# Patient Record
Sex: Female | Born: 1955 | Hispanic: Yes | Marital: Single | Smoking: Never smoker
Health system: Southern US, Community
[De-identification: ages and names within clinical notes are randomized; demographics above are authoritative.]

## PROBLEM LIST (undated history)

## (undated) DIAGNOSIS — Z992 Dependence on renal dialysis: Secondary | ICD-10-CM

## (undated) DIAGNOSIS — I10 Essential (primary) hypertension: Secondary | ICD-10-CM

## (undated) DIAGNOSIS — E119 Type 2 diabetes mellitus without complications: Secondary | ICD-10-CM

## (undated) DIAGNOSIS — N186 End stage renal disease: Secondary | ICD-10-CM

## (undated) HISTORY — PX: EYE SURGERY: SHX253

## (undated) HISTORY — DX: Type 2 diabetes mellitus without complications: E11.9

## (undated) HISTORY — DX: Essential (primary) hypertension: I10

---

## 2004-07-05 ENCOUNTER — Ambulatory Visit: Payer: Self-pay

## 2004-07-12 ENCOUNTER — Ambulatory Visit: Payer: Self-pay

## 2004-08-14 ENCOUNTER — Inpatient Hospital Stay: Payer: Self-pay | Admitting: Internal Medicine

## 2004-08-14 ENCOUNTER — Other Ambulatory Visit: Payer: Self-pay

## 2004-10-10 ENCOUNTER — Ambulatory Visit: Payer: Self-pay | Admitting: Unknown Physician Specialty

## 2005-01-16 ENCOUNTER — Ambulatory Visit: Payer: Self-pay | Admitting: Ophthalmology

## 2005-02-13 ENCOUNTER — Ambulatory Visit: Payer: Self-pay | Admitting: General Surgery

## 2005-05-08 ENCOUNTER — Ambulatory Visit: Payer: Self-pay | Admitting: Ophthalmology

## 2005-05-17 ENCOUNTER — Ambulatory Visit: Payer: Self-pay | Admitting: General Surgery

## 2005-06-24 DIAGNOSIS — N186 End stage renal disease: Secondary | ICD-10-CM

## 2005-06-24 HISTORY — DX: End stage renal disease: N18.6

## 2005-10-30 ENCOUNTER — Ambulatory Visit: Payer: Self-pay

## 2006-03-12 ENCOUNTER — Ambulatory Visit: Payer: Self-pay | Admitting: *Deleted

## 2006-04-28 ENCOUNTER — Emergency Department: Payer: Self-pay | Admitting: Emergency Medicine

## 2006-09-27 ENCOUNTER — Emergency Department: Payer: Self-pay | Admitting: Emergency Medicine

## 2006-09-27 ENCOUNTER — Other Ambulatory Visit: Payer: Self-pay

## 2007-02-08 ENCOUNTER — Ambulatory Visit: Payer: Self-pay | Admitting: Emergency Medicine

## 2007-02-08 ENCOUNTER — Emergency Department: Payer: Self-pay | Admitting: Emergency Medicine

## 2008-01-05 ENCOUNTER — Ambulatory Visit: Payer: Self-pay

## 2009-02-28 ENCOUNTER — Ambulatory Visit: Payer: Self-pay

## 2010-03-20 ENCOUNTER — Ambulatory Visit: Payer: Self-pay

## 2010-03-24 ENCOUNTER — Ambulatory Visit: Payer: Self-pay | Admitting: Gynecologic Oncology

## 2010-04-05 LAB — PATHOLOGY REPORT

## 2010-04-17 ENCOUNTER — Ambulatory Visit: Payer: Self-pay | Admitting: Gynecologic Oncology

## 2010-05-22 ENCOUNTER — Ambulatory Visit: Payer: Self-pay | Admitting: Gynecologic Oncology

## 2010-05-24 ENCOUNTER — Ambulatory Visit: Payer: Self-pay | Admitting: Gynecologic Oncology

## 2010-05-29 ENCOUNTER — Ambulatory Visit: Payer: Self-pay | Admitting: Gynecologic Oncology

## 2010-06-04 LAB — PATHOLOGY REPORT

## 2010-06-05 ENCOUNTER — Ambulatory Visit: Payer: Self-pay | Admitting: Gynecologic Oncology

## 2010-06-24 ENCOUNTER — Ambulatory Visit: Payer: Self-pay | Admitting: Gynecologic Oncology

## 2010-08-07 ENCOUNTER — Ambulatory Visit: Payer: Self-pay | Admitting: Gynecologic Oncology

## 2010-08-23 ENCOUNTER — Ambulatory Visit: Payer: Self-pay | Admitting: Gynecologic Oncology

## 2011-03-05 ENCOUNTER — Ambulatory Visit: Payer: Self-pay

## 2011-09-09 ENCOUNTER — Ambulatory Visit: Payer: Self-pay | Admitting: Nephrology

## 2012-07-07 ENCOUNTER — Emergency Department: Payer: Self-pay | Admitting: Emergency Medicine

## 2012-07-21 ENCOUNTER — Ambulatory Visit: Payer: Self-pay

## 2012-07-29 ENCOUNTER — Other Ambulatory Visit: Payer: Self-pay

## 2012-07-29 LAB — POTASSIUM: Potassium: 5.6 mmol/L — ABNORMAL HIGH (ref 3.5–5.1)

## 2013-09-06 ENCOUNTER — Other Ambulatory Visit: Payer: Self-pay | Admitting: Internal Medicine

## 2013-09-06 LAB — HEMOGLOBIN: HGB: 6.2 g/dL — AB (ref 12.0–16.0)

## 2013-12-01 ENCOUNTER — Ambulatory Visit: Payer: Self-pay

## 2013-12-03 ENCOUNTER — Ambulatory Visit: Payer: Self-pay

## 2014-06-02 ENCOUNTER — Ambulatory Visit: Payer: Self-pay

## 2014-10-07 ENCOUNTER — Other Ambulatory Visit: Payer: Self-pay | Admitting: Oncology

## 2014-10-07 DIAGNOSIS — R920 Mammographic microcalcification found on diagnostic imaging of breast: Secondary | ICD-10-CM

## 2014-11-14 ENCOUNTER — Other Ambulatory Visit
Admission: RE | Admit: 2014-11-14 | Discharge: 2014-11-14 | Disposition: A | Discharge status: Home / Self Care | Payer: Self-pay | Source: Ambulatory Visit | Attending: Internal Medicine | Admitting: Internal Medicine

## 2014-11-14 DIAGNOSIS — E875 Hyperkalemia: Secondary | ICD-10-CM | POA: Insufficient documentation

## 2014-11-14 LAB — POTASSIUM: POTASSIUM: 2.6 mmol/L — AB (ref 3.5–5.1)

## 2014-11-23 ENCOUNTER — Other Ambulatory Visit: Payer: Self-pay | Admitting: Oncology

## 2014-11-23 ENCOUNTER — Ambulatory Visit
Admission: RE | Admit: 2014-11-23 | Discharge: 2014-11-23 | Disposition: A | Discharge status: Home / Self Care | Payer: Self-pay | Source: Ambulatory Visit | Attending: Oncology | Admitting: Oncology

## 2014-11-23 ENCOUNTER — Ambulatory Visit: Payer: Self-pay | Attending: Oncology

## 2014-11-23 VITALS — BP 192/77 | HR 76 | Temp 98.4°F | Resp 17 | Ht 62.21 in | Wt 131.5 lb

## 2014-11-23 DIAGNOSIS — R920 Mammographic microcalcification found on diagnostic imaging of breast: Secondary | ICD-10-CM

## 2014-11-23 DIAGNOSIS — Z0000 Encounter for general adult medical examination without abnormal findings: Secondary | ICD-10-CM

## 2014-11-23 NOTE — Progress Notes (Signed)
Subjective:     Patient ID: Vanessa Salazar, female   DOB: 1956-03-25, 59 y.o.   MRN: [id number]  HPI   Review of Systems     Objective:   Physical Exam  Pulmonary/Chest: Right breast exhibits no inverted nipple, no mass, no nipple discharge, no skin change and no tenderness. Left breast exhibits no inverted nipple, no mass, no nipple discharge, no skin change and no tenderness. Breasts are symmetrical.       Assessment:     59 year old Hispanic  patient presents for [facility] clinic visit. Patient screened, and meets [facility] eligibility.  Patient does not have insurance, Medicare or Medicaid.  Handout given on Affordable Care Act.  CBE unremarkable.  Instructed patient on breast self-exam using teach back method    Plan:        Sent for bilateral screening mammogram.

## 2014-11-30 ENCOUNTER — Other Ambulatory Visit
Admission: RE | Admit: 2014-11-30 | Discharge: 2014-11-30 | Disposition: A | Discharge status: Home / Self Care | Payer: Self-pay | Source: Other Acute Inpatient Hospital | Attending: Internal Medicine | Admitting: Internal Medicine

## 2014-11-30 DIAGNOSIS — E875 Hyperkalemia: Secondary | ICD-10-CM | POA: Insufficient documentation

## 2014-11-30 LAB — POTASSIUM: Potassium: 3.5 mmol/L (ref 3.5–5.1)

## 2015-02-15 NOTE — Progress Notes (Signed)
Mailed patient notification of BIRADS 3 annual follow up for right breast calcifications.  She is scheduled to be seen in [facility] on November 29, 2015 at 12:30 followed by a diagnostic bilateral mammogram.

## 2015-08-27 ENCOUNTER — Emergency Department: Payer: No Typology Code available for payment source

## 2015-08-27 ENCOUNTER — Emergency Department
Admission: EM | Admit: 2015-08-27 | Discharge: 2015-08-27 | Disposition: A | Discharge status: Home / Self Care | Payer: No Typology Code available for payment source | Attending: Emergency Medicine | Admitting: Emergency Medicine

## 2015-08-27 DIAGNOSIS — S8012XA Contusion of left lower leg, initial encounter: Secondary | ICD-10-CM | POA: Diagnosis not present

## 2015-08-27 DIAGNOSIS — Y998 Other external cause status: Secondary | ICD-10-CM | POA: Insufficient documentation

## 2015-08-27 DIAGNOSIS — Y9389 Activity, other specified: Secondary | ICD-10-CM | POA: Insufficient documentation

## 2015-08-27 DIAGNOSIS — Y92410 Unspecified street and highway as the place of occurrence of the external cause: Secondary | ICD-10-CM | POA: Insufficient documentation

## 2015-08-27 DIAGNOSIS — S8992XA Unspecified injury of left lower leg, initial encounter: Secondary | ICD-10-CM | POA: Diagnosis present

## 2015-08-27 DIAGNOSIS — I10 Essential (primary) hypertension: Secondary | ICD-10-CM | POA: Insufficient documentation

## 2015-08-27 DIAGNOSIS — E119 Type 2 diabetes mellitus without complications: Secondary | ICD-10-CM | POA: Diagnosis not present

## 2015-08-27 MED ORDER — TRAMADOL HCL 50 MG PO TABS: 50.0000 mg | ORAL_TABLET | Freq: Four times a day (QID) | ORAL | Status: DC | PRN
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
## 2015-11-29 ENCOUNTER — Encounter: Payer: Self-pay | Admitting: *Deleted

## 2015-11-29 ENCOUNTER — Ambulatory Visit
Admission: RE | Admit: 2015-11-29 | Discharge: 2015-11-29 | Disposition: A | Discharge status: Home / Self Care | Payer: Self-pay | Source: Ambulatory Visit | Attending: Oncology | Admitting: Oncology

## 2015-11-29 ENCOUNTER — Ambulatory Visit: Payer: Self-pay | Attending: Oncology | Admitting: *Deleted

## 2015-11-29 ENCOUNTER — Ambulatory Visit: Payer: Self-pay

## 2015-11-29 VITALS — BP 211/85 | HR 72 | Temp 98.2°F | Ht 63.39 in | Wt 129.9 lb

## 2015-11-29 DIAGNOSIS — Z0000 Encounter for general adult medical examination without abnormal findings: Secondary | ICD-10-CM

## 2015-11-29 DIAGNOSIS — N63 Unspecified lump in unspecified breast: Secondary | ICD-10-CM

## 2015-11-29 NOTE — Progress Notes (Signed)
Subjective:     Patient ID: Vanessa Salazar, female   DOB: Sep 15, 1955, 60 y.o.   MRN: [id number]  HPI   Review of Systems     Objective:   Physical Exam  Pulmonary/Chest: Right breast exhibits no inverted nipple, no mass, no nipple discharge, no skin change and no tenderness. Left breast exhibits no inverted nipple, no mass, no nipple discharge, no skin change and no tenderness. Breasts are symmetrical.    Abdominal:    Genitourinary: No labial fusion. There is no rash, tenderness, lesion or injury on the right labia. There is no rash, tenderness, lesion or injury on the left labia. Cervix exhibits no motion tenderness, no discharge and no friability. No erythema, tenderness or bleeding in the vagina. No foreign body around the vagina. No signs of injury around the vagina. No vaginal discharge found.       Assessment:     60 year old Hispanic [facility].  Last mammo was a birads 3 for 1 year f/u for calcs.  Maritza, the interpreter present during the interview and exam.  Patient has a dressing over the the shunt for her dialysis in the left forearm.  Palpable thrill noted.  On clinical    breast exam I can palpate a thickening in the upper outer quadrant of the right breast.  No dominant mass noted.  Pelvic exam somewhat difficult due to patients inability to tolerate the bimanual exam.  I could only use one finger.  Blood pressure elevated at 211/85.  She is to recheck her blood pressure at [facility] or [facility], and if remains higher than 140/90 she is to follow-up with her primary care or  Provider who prescribes her blood pressure meds.  Hand out on hypertention given to patient. Patient has been screened for eligibility.  She does not have any insurance, Medicare or Medicaid.  She also meets financial eligibility.  Hand-out given on the Affordable Care Act. Plan:     Bilateral diagnostic mammogram ordered with ultrasound for follow-up calcifications and  right breast thickening.

## 2015-11-29 NOTE — Patient Instructions (Signed)
Hipertensin (Hypertension) La hipertensin, conocida comnmente como presin arterial alta, se produce cuando la sangre bombea en las arterias con mucha fuerza. Las arterias son los vasos sanguneos que transportan la sangre desde el corazn hacia todas las partes del cuerpo. Una lectura de la presin arterial consiste en un nmero ms alto sobre un nmero ms bajo, por ejemplo, 110/72. El nmero ms alto (presin sistlica) corresponde a la presin interna de las arterias cuando el corazn bombea sangre. El nmero ms bajo (presin diastlica) corresponde a la presin interna de las arterias cuando el corazn se relaja. En condiciones ideales, la presin arterial debe ser inferior a 120/80. La hipertensin fuerza al corazn a trabajar ms para bombear la sangre. Las arterias pueden estrecharse o ponerse rgidas. La hipertensin no tratada o no controlada puede causar infarto de miocardio, ictus, enfermedad renal y otros problemas. FACTORES DE RIESGO Algunos factores de riesgo de hipertensin son controlables, pero otros no lo son.  Entre los factores de riesgo que usted no puede controlar, se incluyen los siguientes:   La raza. El riesgo es mayor para las personas afroamericanas.  La edad. Los riesgos aumentan con la edad.  El sexo. Antes de los 45aos, los hombres corren ms riesgo que las mujeres. Despus de los 65aos, las mujeres corren ms riesgo que los hombres. Entre los factores de riesgo que usted puede controlar, se incluyen los siguientes:  No hacer la cantidad suficiente de actividad fsica o ejercicio.  Tener sobrepeso.  Consumir mucha grasa, azcar, caloras o sal en la dieta.  Beber alcohol en exceso. SIGNOS Y SNTOMAS Por lo general, la hipertensin no causa signos o sntomas. La hipertensin arterial demasiado alta (crisis hipertensiva) puede causar dolor de cabeza, ansiedad, falta de aire y hemorragia nasal. DIAGNSTICO Para detectar si usted tiene hipertensin, el  mdico le medir la presin arterial mientras est sentado, con el brazo levantado a la altura del corazn. Debe medirla al menos dos veces en el mismo brazo. Determinadas condiciones pueden causar una diferencia de presin arterial entre el brazo izquierdo y el derecho. El hecho de tener una sola lectura de la presin arterial ms alta que lo normal no significa que necesita un tratamiento. Si no est claro si tiene hipertensin arterial, es posible que se le pida que regrese otro da para volver a controlarle la presin arterial. O bien se le puede pedir que se controle la presin arterial en su casa durante 1 o ms meses. TRATAMIENTO El tratamiento de la hipertensin arterial incluye hacer cambios en el estilo de vida y, posiblemente, tomar medicamentos. Un estilo de vida saludable puede ayudar a bajar la presin arterial alta. Quiz deba cambiar algunos hbitos. Los cambios en el estilo de vida pueden incluir lo siguiente:  Seguir la dieta DASH. Esta dieta tiene un alto contenido de frutas, verduras y cereales integrales. Incluye poca cantidad de sal, carnes rojas y azcares agregados.  Mantenga el consumo de sodio por debajo de 2300 mg por da.  Realizar al menos entre 30 y 45 minutos de ejercicio aerbico, 4 veces por semana como mnimo.  Perder peso, si es necesario.  No fumar.  Limitar el consumo de bebidas alcohlicas.  Aprender formas de reducir el estrs. El mdico puede recetarle medicamentos si los cambios en el estilo de vida no son suficientes para lograr controlar la presin arterial y si una de las siguientes afirmaciones es verdadera:  Tiene entre 18 y 59 aos y su presin arterial sistlica est por encima de 140.  Tiene   60 aos o ms y su presin arterial sistlica est por encima de 150.  Su presin arterial diastlica est por encima de 90.  Tiene diabetes y su presin arterial sistlica est por encima de 140 o su presin arterial diastlica est por encima de  90.  Tiene una enfermedad renal y su presin arterial est por encima de 140/90.  Tiene una enfermedad cardaca y su presin arterial est por encima de 140/90. La presin arterial deseada puede variar en funcin de las enfermedades, la edad y otros factores personales. INSTRUCCIONES PARA EL CUIDADO EN EL HOGAR  Haga que le midan de nuevo la presin arterial segn las indicaciones del mdico.  Tome los medicamentos solamente como se lo haya indicado el mdico. Siga cuidadosamente las indicaciones. Los medicamentos para la presin arterial deben tomarse segn las indicaciones. Los medicamentos pierden eficacia al omitir las dosis. El hecho de omitir las dosis tambin Lesothoaumenta el riesgo de otros problemas.  No fume.  Contrlese la presin arterial en su casa segn las indicaciones del mdico. SOLICITE ATENCIN MDICA SI:   Piensa que tiene una reaccin alrgica a los medicamentos.  Tiene mareos o dolores de cabeza con Naval architectrecurrencia.  Tiene hinchazn en los tobillos.  Tiene problemas de visin. SOLICITE ATENCIN MDICA DE INMEDIATO SI:  Siente un dolor de cabeza intenso o confusin.  Siente debilidad inusual, adormecimiento o que Hospital doctorse desmayar.  Siente dolor intenso en el pecho o en el abdomen.  Vomita repetidas veces.  Tiene dificultad para respirar. ASEGRESE DE QUE:   Comprende estas instrucciones.  Controlar su afeccin.  Recibir ayuda de inmediato si no mejora o si empeora.   Esta informacin no tiene Theme park managercomo fin reemplazar el consejo del mdico. Asegrese de hacerle al mdico cualquier pregunta que tenga.   Document Released: 06/10/2005 Document Revised: 10/25/2014 Elsevier Interactive Patient Education 2016 [employer]  Gave patient hand-out, [facility] Staying Healthy, Active and Well [facility], with education on breast health, pap smears, heart and colon health.

## 2015-12-01 ENCOUNTER — Ambulatory Visit
Admission: RE | Admit: 2015-12-01 | Discharge: 2015-12-01 | Disposition: A | Discharge status: Home / Self Care | Payer: Self-pay | Source: Ambulatory Visit | Attending: Oncology | Admitting: Oncology

## 2015-12-01 ENCOUNTER — Ambulatory Visit: Admission: RE | Admit: 2015-12-01 | Payer: Self-pay | Source: Ambulatory Visit

## 2015-12-01 DIAGNOSIS — R921 Mammographic calcification found on diagnostic imaging of breast: Secondary | ICD-10-CM | POA: Insufficient documentation

## 2015-12-12 ENCOUNTER — Encounter: Payer: Self-pay | Admitting: *Deleted

## 2015-12-13 ENCOUNTER — Encounter: Payer: Self-pay | Admitting: *Deleted

## 2015-12-13 NOTE — Progress Notes (Signed)
Letter mailed to inform patient of stable mammogram and need to return in one year.  Next [facility] appointment on 12/09/16 @ 8:00.  [facility] [place].

## 2016-08-25 ENCOUNTER — Inpatient Hospital Stay
Admission: EM | Admit: 2016-08-25 | Discharge: 2016-09-02 | DRG: 535 | Disposition: A | Discharge status: Home / Self Care | Payer: Medicaid Other | Attending: Internal Medicine | Admitting: Internal Medicine

## 2016-08-25 ENCOUNTER — Emergency Department: Payer: Medicaid Other

## 2016-08-25 DIAGNOSIS — S329XXA Fracture of unspecified parts of lumbosacral spine and pelvis, initial encounter for closed fracture: Secondary | ICD-10-CM | POA: Diagnosis present

## 2016-08-25 DIAGNOSIS — E875 Hyperkalemia: Secondary | ICD-10-CM | POA: Diagnosis present

## 2016-08-25 DIAGNOSIS — W109XXA Fall (on) (from) unspecified stairs and steps, initial encounter: Secondary | ICD-10-CM | POA: Diagnosis present

## 2016-08-25 DIAGNOSIS — Z79899 Other long term (current) drug therapy: Secondary | ICD-10-CM

## 2016-08-25 DIAGNOSIS — R509 Fever, unspecified: Secondary | ICD-10-CM

## 2016-08-25 DIAGNOSIS — Z8673 Personal history of transient ischemic attack (TIA), and cerebral infarction without residual deficits: Secondary | ICD-10-CM

## 2016-08-25 DIAGNOSIS — S32591A Other specified fracture of right pubis, initial encounter for closed fracture: Principal | ICD-10-CM | POA: Diagnosis present

## 2016-08-25 DIAGNOSIS — W19XXXA Unspecified fall, initial encounter: Secondary | ICD-10-CM

## 2016-08-25 DIAGNOSIS — N186 End stage renal disease: Secondary | ICD-10-CM | POA: Diagnosis present

## 2016-08-25 DIAGNOSIS — E11649 Type 2 diabetes mellitus with hypoglycemia without coma: Secondary | ICD-10-CM | POA: Diagnosis not present

## 2016-08-25 DIAGNOSIS — Y92009 Unspecified place in unspecified non-institutional (private) residence as the place of occurrence of the external cause: Secondary | ICD-10-CM

## 2016-08-25 DIAGNOSIS — I120 Hypertensive chronic kidney disease with stage 5 chronic kidney disease or end stage renal disease: Secondary | ICD-10-CM | POA: Diagnosis present

## 2016-08-25 DIAGNOSIS — Z794 Long term (current) use of insulin: Secondary | ICD-10-CM

## 2016-08-25 DIAGNOSIS — R0902 Hypoxemia: Secondary | ICD-10-CM | POA: Diagnosis not present

## 2016-08-25 DIAGNOSIS — E1122 Type 2 diabetes mellitus with diabetic chronic kidney disease: Secondary | ICD-10-CM | POA: Diagnosis present

## 2016-08-25 DIAGNOSIS — Z992 Dependence on renal dialysis: Secondary | ICD-10-CM

## 2016-08-25 DIAGNOSIS — D631 Anemia in chronic kidney disease: Secondary | ICD-10-CM | POA: Diagnosis present

## 2016-08-25 DIAGNOSIS — I5033 Acute on chronic diastolic (congestive) heart failure: Secondary | ICD-10-CM | POA: Diagnosis not present

## 2016-08-25 HISTORY — DX: End stage renal disease: N18.6

## 2016-08-25 HISTORY — DX: Dependence on renal dialysis: Z99.2

## 2016-08-25 LAB — CBC WITH DIFFERENTIAL/PLATELET
BASOS ABS: 0 10*3/uL (ref 0–0.1)
BASOS PCT: 0 %
EOS ABS: 0.1 10*3/uL (ref 0–0.7)
EOS PCT: 1 %
HEMATOCRIT: 27.5 % — AB (ref 35.0–47.0)
Hemoglobin: 9 g/dL — ABNORMAL LOW (ref 12.0–16.0)
Lymphocytes Relative: 8 %
Lymphs Abs: 0.4 10*3/uL — ABNORMAL LOW (ref 1.0–3.6)
MCH: 32.7 pg (ref 26.0–34.0)
MCHC: 32.9 g/dL (ref 32.0–36.0)
MCV: 99.6 fL (ref 80.0–100.0)
MONO ABS: 0.3 10*3/uL (ref 0.2–0.9)
MONOS PCT: 6 %
Neutro Abs: 4.4 10*3/uL (ref 1.4–6.5)
Neutrophils Relative %: 85 %
PLATELETS: 136 10*3/uL — AB (ref 150–440)
RBC: 2.76 MIL/uL — ABNORMAL LOW (ref 3.80–5.20)
RDW: 19.3 % — AB (ref 11.5–14.5)
WBC: 5.2 10*3/uL (ref 3.6–11.0)

## 2016-08-25 LAB — BASIC METABOLIC PANEL
ANION GAP: 14 (ref 5–15)
BUN: 57 mg/dL — ABNORMAL HIGH (ref 6–20)
CALCIUM: 9.1 mg/dL (ref 8.9–10.3)
CO2: 20 mmol/L — AB (ref 22–32)
Chloride: 103 mmol/L (ref 101–111)
Creatinine, Ser: 8.75 mg/dL — ABNORMAL HIGH (ref 0.44–1.00)
GFR, EST AFRICAN AMERICAN: 5 mL/min — AB (ref >60)
GFR, EST NON AFRICAN AMERICAN: 4 mL/min — AB (ref >60)
Glucose, Bld: 270 mg/dL — ABNORMAL HIGH (ref 65–99)
Potassium: 5.8 mmol/L — ABNORMAL HIGH (ref 3.5–5.1)
SODIUM: 137 mmol/L (ref 135–145)

## 2016-08-25 MED ORDER — MORPHINE SULFATE (PF) 4 MG/ML IV SOLN
4.0000 mg | Freq: Once | INTRAVENOUS | Status: AC
  Administered 2016-08-25: 4 mg via INTRAVENOUS
  Filled 2016-08-25: qty 1

## 2016-08-25 MED ORDER — HYDROMORPHONE HCL 1 MG/ML IJ SOLN
0.5000 mg | Freq: Once | INTRAMUSCULAR | Status: AC
  Administered 2016-08-25: 0.5 mg via INTRAVENOUS
  Filled 2016-08-25: qty 1

## 2016-08-25 NOTE — ED Triage Notes (Addendum)
Pt arrived via EMS from home after a fall; pt c/o pain to right hip and right inner thigh area; unable to bear weight after fall; pt was going to the bathroom for a bowel movement when she fell; pt arrived incontinent of large amount of stool; cleaned well and pt placed in clean gown; pt embarrassed but appreciative; pt speaks Spanish only; interpreter now present;

## 2016-08-25 NOTE — H&P (Signed)
History and Physical   [facility] [place] [facility] Ad[facility] History and Physical [employer] Juna Caban, D.O.    Patient Name: Vanessa Salazar MR#: [id number] Date of Birth: 25-Dec-1955 Date of Admission: 08/25/2016  Referring MD/NP/PA: Dr. Derrill KayGoodman Primary Care Physician: No PCP Per Patient Outpatient Special[facility]  Patient coming from: Home  Chief Complaint: Fall  HPI: Vanessa Salazar is a 61 y.o. female with a known history of DM, HTN, ESRD on HD, anemia, TIA was in a usual state of health until this evening when she sustained a mechanical fall landing on her right hip. Patient states that she did not see a threshold leading from one room to another. She denies any preceding symptoms suggesting syncope or loss of consciousness. He denies head trauma. Patient was unable to ambulate following the fall. She has received Dilaudid, morphine and oxycodone in the emergency department and reports no relief of her pain.   Otherwise there has been no change in status. Patient has been taking medication as prescribed and there has been no recent change in medication or diet.  No recent antibiotics.  There has been no recent illness, hospitalizations, travel or sick contacts.    Patient denies fevers/chills, weakness, dizziness, chest pain, shortness of breath, N/V/C/D, abdominal pain, dysuria/frequency, changes in mental status.   ED Course: Patient received Dilaudid, morphine  Review of Systems:  CONSTITUTIONAL: No fever/chills, fatigue, weakness, weight gain/loss, headache. EYES: No blurry or double vision. ENT: No tinnitus, postnasal drip, redness or soreness of the oropharynx. RESPIRATORY: No cough, dyspnea, wheeze.  No hemoptysis.  CARDIOVASCULAR: No chest pain, palpitations, syncope, orthopnea. No lower extremity edema.  GASTROINTESTINAL: No nausea, vomiting, abdominal pain, diarrhea, constipation.  No hematemesis, melena or hematochezia. GENITOURINARY: No  dysuria, frequency, hematuria. ENDOCRINE: No polyuria or nocturia. No heat or cold intolerance. HEMATOLOGY: No anemia, bruising, bleeding. INTEGUMENTARY: No rashes, ulcers, lesions. MUSCULOSKELETAL: No arthritis, gout, dyspnea. positive right hip and pelvis pain  NEUROLOGIC: No numbness, tingling, ataxia, seizure-type activity, weakness. PSYCHIATRIC: No anxiety, depression, insomnia.   Past Medical History:  Diagnosis Date  . Diabetes mellitus without complication   . Hypertension   End-stage renal disease on hemodialysis, chronic anemia  Past Surgical History:  Procedure Laterality Date  . CESAREAN SECTION     X3  . EYE SURGERY       reports that she has never smoked. She has never used smokeless tobacco. She reports that she does not drink alcohol or use drugs.  No Known Allergies  No family history on file. Family history has been reviewed and confirmed with patient.   Prior to Admission medications   Medication Sig Start Date End Date Taking? Authorizing Provider  traMADol (ULTRAM) 50 MG tablet Take 1 tablet (50 mg total) by mouth every 6 (six) hours as needed. 08/27/15 08/26/16  Evangeline Dakinharles M Beers, PA-C    Physical Exam: Vitals:   08/25/16 2034 08/25/16 2035 08/25/16 2211 08/25/16 2230  BP: 125/67  (!) 155/69 (!) 152/72  Pulse: 77  71 74  Resp: 18  18   Temp: 97.8 F (36.6 C)     TempSrc: Oral     SpO2: 98%  98% 99%  Weight:  55 kg (121 lb 4.1 oz)    Height:  5\' 2"  (1.575 m)      GENERAL: 61 y.o.-year-old hispanic female patient, well-developed, well-nourished lying in the bed in mild distress.  HEENT: Head atraumatic, normocephalic. Pupils equal, round, reactive to light and accommodation. No  scleral icterus. Extraocular muscles intact. Nares are patent. Oropharynx is clear. Mucus membranes moist. NECK: Supple, full range of motion. No JVD, no bruit heard. No thyroid enlargement, no tenderness, no cervical lymphadenopathy. CHEST: Normal breath sounds bilaterally. No  wheezing, rales, rhonchi or crackles. No use of accessory muscles of respiration.  No reproducible chest wall tenderness.  CARDIOVASCULAR: S1, S2 normal. No murmurs, rubs, or gallops. Cap refill <2 seconds. Pulses intact distally.  ABDOMEN: Soft, nondistended, nontender. No rebound, guarding, rigidity. Normoactive bowel sounds present in all four quadrants. No organomegaly or mass. EXTREMITIES:  tenderness to palpation over the right hip. Minimal range of motion secondary to painNo pedal edema, cyanosis, or clubbing. No calf tenderness or Homan's sign.  NEUROLOGIC: The patient is alert and oriented x 3. Cranial nerves II through XII are grossly intact with no focal sensorimotor deficit. Muscle strength 5/5 in all extremities. Sensation intact. Gait not checked.   Labs on Admission:  CBC:  Recent Labs Lab 08/25/16 2204  WBC 5.2  NEUTROABS 4.4  HGB 9.0*  HCT 27.5*  MCV 99.6  PLT 136*   Basic Metabolic Panel:  Recent Labs Lab 08/25/16 2204  NA 137  K 5.8*  CL 103  CO2 20*  GLUCOSE 270*  BUN 57*  CREATININE 8.75*  CALCIUM 9.1   GFR: Estimated Creatinine Clearance: 5.5 mL/min (by C-G formula based on SCr of 8.75 mg/dL (H)). Liver Function Tests: No results for input(s): AST, ALT, ALKPHOS, BILITOT, PROT, ALBUMIN in the last 168 hours. No results for input(s): LIPASE, AMYLASE in the last 168 hours. No results for input(s): AMMONIA in the last 168 hours. Coagulation Profile: No results for input(s): INR, PROTIME in the last 168 hours. Cardiac Enzymes: No results for input(s): CKTOTAL, CKMB, CKMBINDEX, TROPONINI in the last 168 hours. BNP (last 3 results) No results for input(s): PROBNP in the last 8760 hours. HbA1C: No results for input(s): HGBA1C in the last 72 hours. CBG: No results for input(s): GLUCAP in the last 168 hours. Lipid Profile: No results for input(s): CHOL, HDL, LDLCALC, TRIG, CHOLHDL, LDLDIRECT in the last 72 hours. Thyroid Function Tests: No results for  input(s): TSH, T4TOTAL, FREET4, T3FREE, THYROIDAB in the last 72 hours. Anemia Panel: No results for input(s): VITAMINB12, FOLATE, FERRITIN, TIBC, IRON, RETICCTPCT in the last 72 hours. Urine analysis: No results found for: COLORURINE, APPEARANCEUR, LABSPEC, PHURINE, GLUCOSEU, HGBUR, BILIRUBINUR, KETONESUR, PROTEINUR, UROBILINOGEN, NITRITE, LEUKOCYTESUR Sepsis Labs: @LABRCNTIP (procalcitonin:4,lacticidven:4) )No results found for this or any previous visit (from the past 240 hour(s)).   Radiological Exams on Admission: Dg Hip Unilat W Or Wo Pelvis 2-3 Views Right  Result Date: 08/25/2016 CLINICAL DATA:  Right hip and inner thigh pain. Unable to bear weight after fall. EXAM: DG HIP (WITH OR WITHOUT PELVIS) 2-3V RIGHT COMPARISON:  None. FINDINGS: There is an acute minimally displaced fracture of the right superior pubic ramus and parasymphysis. Fracture likely involves the inferior pubic ramus near the pubic symphysis given its subtle lucency. AVN of the right femoral head with flattening is noted. Slight joint space narrowing of both hips. The visualized lumbar spine is intact. 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 The iliac bones are maintained. Sclerotic foci of the left iliac bone and left femoral head are keeping with bone islands. IMPRESSION: 1. AVN of the right femoral head with slight flattening of the weight-bearing portion of the femoral head. 2. There is a right minimally displaced superior pubic ramus fracture with more subtle lucency suspicious for fracture at the junction  of the inferior pubic ramus and pubic symphysis. Electronically Signed   By: Tollie Eth M.D.   On: 08/25/2016 21:13   Assessment/Plan   This is a 61 y.o. female with a history of DM, HTN, ESRD on HD, anemia, TIA  now being admitted with:  1. R pelvic fracture s/p mechanical fall -Admit inpatient -Ortho consult -Pain control -PT evaluation  2. H/o Diabetes - Accuchecks achs with RISS coverage - Heart  healthy, carb controlled diet  3. Hypertension - Monitor  4. History of end-stage renal disease on hemodialysis Monday Wednesday Friday -Nephrology consult for hemodialysis   Admission status: Inpatient IV Fluids: Normal saline Diet/Nutrition: Heart healthy, carb controlled Consults called: Ortho, nephrology DVT Px: Heparin SCDs and early ambulation. Code Status: Full Code  Disposition Plan: To be determined   All the records are reviewed and case discussed with ED provider. Management plans discussed with the patient and/or family who express understanding and agree with plan of care.  Granvel Proudfoot D.O. on 08/25/2016 at 11:14 PM Between 7am to 6pm - Pager - [phone] After 6pm go to [email or web address] - [employer] [facility] [place] [facility] Office [phone] CC: Primary care physician; No PCP Per Patient   08/25/2016, 11:14 PM

## 2016-08-25 NOTE — ED Notes (Signed)
Patient transported to X-ray 

## 2016-08-25 NOTE — ED Provider Notes (Signed)
[facility] Emergency Department Provider Note   ____________________________________________   I have reviewed the triage vital signs and the nursing notes.   HISTORY  Chief Complaint Fall and Hip Pain   History limited by: Language [facility] Interpreter utilized   HPI Vanessa Salazar is a 61 y.o. female who presents to the emergency department today because of concerns for right hip pain after a fall. The patient states that she fell down 2 steps. She did not appreciate that the steps were there as when she fell. She denied blacking out. She denied hitting her head. The patient denies pain anywhere else. Per EMS patient was not able to stand on the leg after this happened. She denies any hip injuries in the past.   Past Medical History:  Diagnosis Date  . Diabetes mellitus without complication   . Hypertension     There are no active problems to display for this patient.   Past Surgical History:  Procedure Laterality Date  . CESAREAN SECTION     X3  . EYE SURGERY      Prior to Admission medications   Medication Sig Start Date End Date Taking? Authorizing Provider  traMADol (ULTRAM) 50 MG tablet Take 1 tablet (50 mg total) by mouth every 6 (six) hours as needed. 08/27/15 08/26/16  Evangeline Dakin, PA-C    Allergies Patient has no known allergies.  No family history on file.  Social History Social History  Substance Use Topics  . Smoking status: Never Smoker  . Smokeless tobacco: Never Used  . Alcohol use No    Review of Systems  Constitutional: Negative for fever. Cardiovascular: Negative for chest pain. Respiratory: Negative for shortness of breath. Gastrointestinal: Negative for abdominal pain, vomiting and diarrhea. Genitourinary: Negative for dysuria. Musculoskeletal: Positive for right hip pain. Skin: Negative for rash. Neurological: Negative for headaches, focal weakness or numbness.  10-point ROS otherwise  negative.  ____________________________________________   PHYSICAL EXAM:  VITAL SIGNS: ED Triage Vitals  Enc Vitals Group     BP 08/25/16 2034 125/67     Pulse Rate 08/25/16 2034 77     Resp 08/25/16 2034 18     Temp 08/25/16 2034 97.8 F (36.6 C)     Temp Source 08/25/16 2034 Oral     SpO2 08/25/16 2034 98 %     Weight 08/25/16 2035 121 lb 4.1 oz (55 kg)     Height 08/25/16 2035 5\' 2"  (1.575 m)     Head Circumference --      Peak Flow --      Pain Score 08/25/16 2036 10    Constitutional: Alert and oriented. Well appearing and in no distress. Eyes: Conjunctivae are normal. Normal extraocular movements. ENT   Head: Normocephalic and atraumatic.   Nose: No congestion/rhinnorhea.   Mouth/Throat: Mucous membranes are moist.   Neck: No stridor. Hematological/Lymphatic/Immunilogical: No cervical lymphadenopathy. Cardiovascular: Normal rate, regular rhythm.  No murmurs, rubs, or gallops. Respiratory: Normal respiratory effort without tachypnea nor retractions. Breath sounds are clear and equal bilaterally. No wheezes/rales/rhonchi. Gastrointestinal: Soft and non tender. No rebound. No guarding.  Genitourinary: Deferred Musculoskeletal: Right leg not shortened nor externally rotated. Tender to palpation and rotation. Neurologic:  Normal speech and language. No gross focal neurologic deficits are appreciated.  Skin:  Skin is warm, dry and intact. No rash noted. Psychiatric: Mood and affect are normal. Speech and behavior are normal. Patient exhibits appropriate insight and judgment.  ____________________________________________  LABS (pertinent positives/negatives)  Labs Reviewed  CBC WITH DIFFERENTIAL/PLATELET - Abnormal; Notable for the following:       Result Value   RBC 2.76 (*)    Hemoglobin 9.0 (*)    HCT 27.5 (*)    RDW 19.3 (*)    Platelets 136 (*)    Lymphs Abs 0.4 (*)    All other components within normal limits  BASIC METABOLIC PANEL -  Abnormal; Notable for the following:    Potassium 5.8 (*)    CO2 20 (*)    Glucose, Bld 270 (*)    BUN 57 (*)    Creatinine, Ser 8.75 (*)    GFR calc non Af Amer 4 (*)    GFR calc Af Amer 5 (*)    All other components within normal limits     ____________________________________________   EKG  None  ____________________________________________    RADIOLOGY  Right hip IMPRESSION:  1. AVN of the right femoral head with slight flattening of the  weight-bearing portion of the femoral head.  2. There is a right minimally displaced superior pubic ramus  fracture with more subtle lucency suspicious for fracture at the  junction of the inferior pubic ramus and pubic symphysis.   I, Denya Buckingham, personally viewed and evaluated these images (plain radiographs) as part of my medical decision making. ____________________________________________   PROCEDURES  Procedures  ____________________________________________   INITIAL IMPRESSION / ASSESSMENT AND PLAN / ED COURSE  Pertinent labs & imaging results that were available during my care of the patient were reviewed by me and considered in my medical decision making (see chart for details).  Patient presented to the emergency department today with right hip pain after a fall. On exam patient's leg is neither shortened or externally rotated. X-rays do show a pubic rami fracture. Patient will be given IV pain medication here. We will plan on admission to hospital service.  ____________________________________________   FINAL CLINICAL IMPRESSION(S) / ED DIAGNOSES  Final diagnoses:  Fall, initial encounter  Closed nondisplaced fracture of pelvis, unspecified part of pelvis, initial encounter [facility](HCC)     Note: This dictation was prepared with Dragon dictation. Any transcriptional errors that result from this process are unintentional     Phineas SemenGraydon Lynae Pederson, MD 08/25/16 [phone]

## 2016-08-26 LAB — CBC
HEMATOCRIT: 23.3 % — AB (ref 35.0–47.0)
HEMOGLOBIN: 7.9 g/dL — AB (ref 12.0–16.0)
MCH: 33.8 pg (ref 26.0–34.0)
MCHC: 33.9 g/dL (ref 32.0–36.0)
MCV: 99.7 fL (ref 80.0–100.0)
PLATELETS: 112 10*3/uL — AB (ref 150–440)
RBC: 2.34 MIL/uL — AB (ref 3.80–5.20)
RDW: 19.4 % — ABNORMAL HIGH (ref 11.5–14.5)
WBC: 5.4 10*3/uL (ref 3.6–11.0)

## 2016-08-26 LAB — COMPREHENSIVE METABOLIC PANEL
ALK PHOS: 115 U/L (ref 38–126)
ALT: 11 U/L — AB (ref 14–54)
AST: 20 U/L (ref 15–41)
Albumin: 3.7 g/dL (ref 3.5–5.0)
Anion gap: 11 (ref 5–15)
BILIRUBIN TOTAL: 0.6 mg/dL (ref 0.3–1.2)
BUN: 62 mg/dL — AB (ref 6–20)
CALCIUM: 8.5 mg/dL — AB (ref 8.9–10.3)
CHLORIDE: 104 mmol/L (ref 101–111)
CO2: 22 mmol/L (ref 22–32)
CREATININE: 9.22 mg/dL — AB (ref 0.44–1.00)
GFR, EST AFRICAN AMERICAN: 5 mL/min — AB (ref >60)
GFR, EST NON AFRICAN AMERICAN: 4 mL/min — AB (ref >60)
Glucose, Bld: 188 mg/dL — ABNORMAL HIGH (ref 65–99)
Potassium: 5.8 mmol/L — ABNORMAL HIGH (ref 3.5–5.1)
Sodium: 137 mmol/L (ref 135–145)
Total Protein: 6.2 g/dL — ABNORMAL LOW (ref 6.5–8.1)

## 2016-08-26 LAB — GLUCOSE, CAPILLARY
GLUCOSE-CAPILLARY: 224 mg/dL — AB (ref 65–99)
GLUCOSE-CAPILLARY: 42 mg/dL — AB (ref 65–99)
GLUCOSE-CAPILLARY: 85 mg/dL (ref 65–99)
Glucose-Capillary: 208 mg/dL — ABNORMAL HIGH (ref 65–99)
Glucose-Capillary: 82 mg/dL (ref 65–99)
Glucose-Capillary: 126 mg/dL — ABNORMAL HIGH (ref 65–99)
Glucose-Capillary: 57 mg/dL — ABNORMAL LOW (ref 65–99)

## 2016-08-26 MED ORDER — OXYCODONE HCL 5 MG PO TABS
5.0000 mg | ORAL_TABLET | ORAL | Status: DC | PRN
  Administered 2016-08-26 – 2016-08-27 (×4): 10 mg via ORAL
  Administered 2016-08-28 – 2016-08-30 (×7): 5 mg via ORAL
  Filled 2016-08-26 (×2): qty 1
  Filled 2016-08-26: qty 2
  Filled 2016-08-26 (×2): qty 1
  Filled 2016-08-26: qty 2
  Filled 2016-08-26 (×2): qty 1
  Filled 2016-08-26 (×2): qty 2
  Filled 2016-08-26: qty 1

## 2016-08-26 MED ORDER — INSULIN ASPART PROT & ASPART (70-30 MIX) 100 UNIT/ML ~~LOC~~ SUSP
8.0000 [IU] | Freq: Two times a day (BID) | SUBCUTANEOUS | Status: DC
Start: 2016-08-26 — End: 2016-08-27
  Administered 2016-08-26 (×2): 8 [IU] via SUBCUTANEOUS
  Filled 2016-08-26 (×2): qty 8
  Filled 2016-08-26: qty 3

## 2016-08-26 MED ORDER — ACETAMINOPHEN 325 MG PO TABS
650.0000 mg | ORAL_TABLET | Freq: Four times a day (QID) | ORAL | Status: DC | PRN
  Administered 2016-08-27 (×2): 650 mg via ORAL
  Filled 2016-08-26 (×2): qty 2

## 2016-08-26 MED ORDER — MAGNESIUM CITRATE PO SOLN
1.0000 | Freq: Once | ORAL | Status: AC | PRN
  Administered 2016-09-01: 1 via ORAL
  Filled 2016-08-26: qty 296

## 2016-08-26 MED ORDER — ATORVASTATIN CALCIUM 20 MG PO TABS
20.0000 mg | ORAL_TABLET | Freq: Every evening | ORAL | Status: DC
  Administered 2016-08-26 – 2016-09-01 (×7): 20 mg via ORAL
  Filled 2016-08-26 (×7): qty 1

## 2016-08-26 MED ORDER — ONDANSETRON HCL 4 MG/2ML IJ SOLN
4.0000 mg | Freq: Four times a day (QID) | INTRAMUSCULAR | Status: DC | PRN
  Administered 2016-08-28 – 2016-09-02 (×3): 4 mg via INTRAVENOUS
  Filled 2016-08-26 (×3): qty 2

## 2016-08-26 MED ORDER — ONDANSETRON HCL 4 MG PO TABS: 4.0000 mg | ORAL_TABLET | Freq: Four times a day (QID) | ORAL | Status: DC | PRN

## 2016-08-26 MED ORDER — PANTOPRAZOLE SODIUM 40 MG PO TBEC
40.0000 mg | DELAYED_RELEASE_TABLET | Freq: Every day | ORAL | Status: DC
Start: 2016-08-26 — End: 2016-08-30
  Administered 2016-08-26 – 2016-08-29 (×4): 40 mg via ORAL
  Filled 2016-08-26 (×4): qty 1

## 2016-08-26 MED ORDER — HYDRALAZINE HCL 50 MG PO TABS
100.0000 mg | ORAL_TABLET | Freq: Two times a day (BID) | ORAL | Status: DC
  Administered 2016-08-26 – 2016-09-02 (×15): 100 mg via ORAL
  Filled 2016-08-26 (×15): qty 2

## 2016-08-26 MED ORDER — GLUCOSE 40 % PO GEL
ORAL | Status: AC
  Administered 2016-08-26: 37.5 g via ORAL
  Filled 2016-08-26: qty 1

## 2016-08-26 MED ORDER — ACETAMINOPHEN 650 MG RE SUPP: 650.0000 mg | Freq: Four times a day (QID) | RECTAL | Status: DC | PRN

## 2016-08-26 MED ORDER — HYDROMORPHONE HCL 1 MG/ML IJ SOLN
1.0000 mg | Freq: Once | INTRAMUSCULAR | Status: AC
  Administered 2016-08-26: 1 mg via INTRAVENOUS
  Filled 2016-08-26: qty 1

## 2016-08-26 MED ORDER — AMLODIPINE BESYLATE 5 MG PO TABS
10.0000 mg | ORAL_TABLET | Freq: Every day | ORAL | Status: DC
  Administered 2016-08-26 – 2016-08-29 (×4): 10 mg via ORAL
  Filled 2016-08-26 (×4): qty 2

## 2016-08-26 MED ORDER — MORPHINE SULFATE (PF) 2 MG/ML IV SOLN
1.0000 mg | INTRAVENOUS | Status: DC | PRN
  Administered 2016-08-26: 1 mg via INTRAVENOUS
  Filled 2016-08-26: qty 1

## 2016-08-26 MED ORDER — IPRATROPIUM BROMIDE 0.02 % IN SOLN: 0.5000 mg | Freq: Four times a day (QID) | RESPIRATORY_TRACT | Status: DC | PRN

## 2016-08-26 MED ORDER — HEPARIN SODIUM (PORCINE) 5000 UNIT/ML IJ SOLN
5000.0000 [IU] | Freq: Three times a day (TID) | INTRAMUSCULAR | Status: DC
  Administered 2016-08-26 – 2016-09-02 (×22): 5000 [IU] via SUBCUTANEOUS
  Filled 2016-08-26 (×22): qty 1

## 2016-08-26 MED ORDER — DOCUSATE SODIUM 100 MG PO CAPS
100.0000 mg | ORAL_CAPSULE | Freq: Two times a day (BID) | ORAL | Status: DC
  Administered 2016-08-26 – 2016-09-02 (×13): 100 mg via ORAL
  Filled 2016-08-26 (×13): qty 1

## 2016-08-26 MED ORDER — HYDRALAZINE HCL 20 MG/ML IJ SOLN
10.0000 mg | Freq: Once | INTRAMUSCULAR | Status: AC
  Administered 2016-08-26: 10 mg via INTRAVENOUS
  Filled 2016-08-26: qty 1

## 2016-08-26 MED ORDER — ALBUTEROL SULFATE (2.5 MG/3ML) 0.083% IN NEBU: 2.5000 mg | INHALATION_SOLUTION | Freq: Four times a day (QID) | RESPIRATORY_TRACT | Status: DC | PRN

## 2016-08-26 MED ORDER — BISACODYL 5 MG PO TBEC
5.0000 mg | DELAYED_RELEASE_TABLET | Freq: Every day | ORAL | Status: DC | PRN
  Administered 2016-08-30 – 2016-08-31 (×2): 5 mg via ORAL
  Filled 2016-08-26 (×3): qty 1

## 2016-08-26 MED ORDER — OXYCODONE HCL 5 MG PO TABS
5.0000 mg | ORAL_TABLET | ORAL | Status: DC | PRN
  Administered 2016-08-26 (×2): 5 mg via ORAL
  Filled 2016-08-26 (×2): qty 1

## 2016-08-26 MED ORDER — FUROSEMIDE 40 MG PO TABS
80.0000 mg | ORAL_TABLET | Freq: Every day | ORAL | Status: DC
  Administered 2016-08-26 – 2016-09-02 (×8): 80 mg via ORAL
  Filled 2016-08-26 (×8): qty 2

## 2016-08-26 MED ORDER — SEVELAMER CARBONATE 800 MG PO TABS
1600.0000 mg | ORAL_TABLET | Freq: Three times a day (TID) | ORAL | Status: DC
  Administered 2016-08-26 – 2016-09-02 (×21): 1600 mg via ORAL
  Filled 2016-08-26 (×22): qty 2

## 2016-08-26 MED ORDER — SODIUM CHLORIDE 0.9 % IV SOLN
INTRAVENOUS | Status: DC
  Administered 2016-08-26: 01:00:00 via INTRAVENOUS

## 2016-08-26 MED ORDER — INSULIN ASPART 100 UNIT/ML ~~LOC~~ SOLN
0.0000 [IU] | Freq: Three times a day (TID) | SUBCUTANEOUS | Status: DC
  Administered 2016-08-26: 3 [IU] via SUBCUTANEOUS
  Administered 2016-08-26: 1 [IU] via SUBCUTANEOUS
  Administered 2016-08-27: 2 [IU] via SUBCUTANEOUS
  Administered 2016-08-27: 1 [IU] via SUBCUTANEOUS
  Administered 2016-08-28: 2 [IU] via SUBCUTANEOUS
  Administered 2016-08-28: 1 [IU] via SUBCUTANEOUS
  Administered 2016-08-28: 2 [IU] via SUBCUTANEOUS
  Administered 2016-08-29: 1 [IU] via SUBCUTANEOUS
  Administered 2016-08-29: 2 [IU] via SUBCUTANEOUS
  Administered 2016-08-29: 5 [IU] via SUBCUTANEOUS
  Administered 2016-08-30: 3 [IU] via SUBCUTANEOUS
  Administered 2016-08-31: 5 [IU] via SUBCUTANEOUS
  Administered 2016-08-31 – 2016-09-01 (×3): 2 [IU] via SUBCUTANEOUS
  Administered 2016-09-01: 5 [IU] via SUBCUTANEOUS
  Administered 2016-09-01 – 2016-09-02 (×2): 2 [IU] via SUBCUTANEOUS
  Administered 2016-09-02: 1 [IU] via SUBCUTANEOUS
  Filled 2016-08-26: qty 1
  Filled 2016-08-26: qty 2
  Filled 2016-08-26 (×2): qty 1
  Filled 2016-08-26: qty 2
  Filled 2016-08-26: qty 5
  Filled 2016-08-26: qty 2
  Filled 2016-08-26: qty 5
  Filled 2016-08-26 (×2): qty 2
  Filled 2016-08-26: qty 5
  Filled 2016-08-26 (×2): qty 2
  Filled 2016-08-26: qty 1
  Filled 2016-08-26 (×2): qty 2
  Filled 2016-08-26: qty 3
  Filled 2016-08-26: qty 1

## 2016-08-26 MED ORDER — SENNOSIDES-DOCUSATE SODIUM 8.6-50 MG PO TABS: 1.0000 | ORAL_TABLET | Freq: Every evening | ORAL | Status: DC | PRN

## 2016-08-26 MED ORDER — INSULIN ASPART 100 UNIT/ML ~~LOC~~ SOLN
0.0000 [IU] | Freq: Every day | SUBCUTANEOUS | Status: DC
  Administered 2016-08-26: 2 [IU] via SUBCUTANEOUS
  Administered 2016-08-27: 4 [IU] via SUBCUTANEOUS
  Filled 2016-08-26: qty 2
  Filled 2016-08-26: qty 4

## 2016-08-26 MED ORDER — MORPHINE SULFATE (PF) 2 MG/ML IV SOLN
2.0000 mg | INTRAVENOUS | Status: DC | PRN
  Administered 2016-08-26: 2 mg via INTRAVENOUS
  Filled 2016-08-26: qty 1

## 2016-08-26 MED ORDER — CINACALCET HCL 30 MG PO TABS
30.0000 mg | ORAL_TABLET | Freq: Every day | ORAL | Status: DC
  Administered 2016-08-28 – 2016-09-02 (×5): 30 mg via ORAL
  Filled 2016-08-26 (×6): qty 1

## 2016-08-26 NOTE — Progress Notes (Signed)
Pre HD  

## 2016-08-26 NOTE — Progress Notes (Signed)
Pre HD assessment  

## 2016-08-26 NOTE — Consult Note (Signed)
ORTHOPAEDIC CONSULTATION  REQUESTING PHYSICIAN: Vanessa Hamming, MD  Chief Complaint: Right pelvic pain  HPI: Vanessa Salazar is a 61 y.o. female who complains of  right pelvic pain following a mechanical fall at home last night. Exam and x-rays at [facility] last night showed a minimally displaced pubic ramus fracture on the right. Patient has a reveal some necrosis of the femoral head. The patient has diabetes mellitus and end-stage renal disease with hemodialysis. She was admitted for pain control.  Past Medical History:  Diagnosis Date  . Diabetes mellitus without complication   . Hypertension    Past Surgical History:  Procedure Laterality Date  . CESAREAN SECTION     X3  . EYE SURGERY     Social History   Social History  . Marital status: Single    Spouse name: N/A  . Number of children: N/A  . Years of education: N/A   Social History Main Topics  . Smoking status: Never Smoker  . Smokeless tobacco: Never Used  . Alcohol use No  . Drug use: No  . Sexual activity: Yes   Other Topics Concern  . Not on file   Social History Narrative  . No narrative on file   No family history on file. No Known Allergies Prior to Admission medications   Medication Sig Start Date End Date Taking? Authorizing Provider  acetaminophen (TYLENOL) 325 MG tablet Take 650 mg by mouth every 6 (six) hours as needed.   Yes Historical Provider, MD  amLODipine (NORVASC) 10 MG tablet Take 10 mg by mouth daily.   Yes Historical Provider, MD  atorvastatin (LIPITOR) 20 MG tablet Take 20 mg by mouth every evening.   Yes Historical Provider, MD  cinacalcet (SENSIPAR) 30 MG tablet Take 30 mg by mouth daily. 05/21/16  Yes Historical Provider, MD  furosemide (LASIX) 40 MG tablet Take 80 mg by mouth daily. 05/10/16  Yes Historical Provider, MD  hydrALAZINE (APRESOLINE) 100 MG tablet Take 100 mg by mouth 2 (two) times daily. 08/30/15  Yes Historical Provider, MD  insulin  NPH-regular Human (NOVOLIN 70/30) (70-30) 100 UNIT/ML injection Inject 8-10 Units into the skin 2 (two) times daily. 05/01/12  Yes Historical Provider, MD  OMEPRAZOLE PO Take 1 capsule by mouth daily.   Yes Historical Provider, MD  sevelamer (RENAGEL) 800 MG tablet Take 1,600 mg by mouth 4 (four) times daily as needed.   Yes Historical Provider, MD   Dg Hip Unilat W Or Wo Pelvis 2-3 Views Right  Result Date: 08/25/2016 CLINICAL DATA:  Right hip and inner thigh pain. Unable to bear weight after fall. EXAM: DG HIP (WITH OR WITHOUT PELVIS) 2-3V RIGHT COMPARISON:  None. FINDINGS: There is an acute minimally displaced fracture of the right superior pubic ramus and parasymphysis. Fracture likely involves the inferior pubic ramus near the pubic symphysis given its subtle lucency. AVN of the right femoral head with flattening is noted. Slight joint space narrowing of both hips. The visualized lumbar spine is intact. 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 The iliac bones are maintained. Sclerotic foci of the left iliac bone and left femoral head are keeping with bone islands. IMPRESSION: 1. AVN of the right femoral head with slight flattening of the weight-bearing portion of the femoral head. 2. There is a right minimally displaced superior pubic ramus fracture with more subtle lucency suspicious for fracture at the junction of the inferior pubic ramus and pubic symphysis. Electronically Signed   By: Onalee Hua  Sterling BigKwon M.D.   On: 08/25/2016 21:13    Positive ROS: All other systems have been reviewed and were otherwise negative with the exception of those mentioned in the HPI and as above.  Physical Exam: General: Alert, no acute distress Cardiovascular: No pedal edema Respiratory: No cyanosis, no use of accessory musculature GI: No organomegaly, abdomen is soft and non-tender Skin: No lesions in the area of chief complaint Neurologic: Sensation intact distally Psychiatric: Patient is competent for consent with  normal mood and affect Lymphatic: No axillary or cervical lymphadenopathy  MUSCULOSKELETAL: The right hip has pain with range of motion. There is no shortening or rotational deformity. There is tenderness over the pubic rami. The left hip and leg is normal. Progressive status is good in both extremities.  Assessment: Displaced right pubic rami fracture  Plan: Progressive mobilization with PT. Partial weight-bearing right leg with walker. Make appointment for recheck and x-ray in my office in approximately 2 weeks.    Valinda HoarMILLER,Vanessa Bells E, MD [phone]   08/26/2016 2:37 PM

## 2016-08-26 NOTE — Care Management Note (Addendum)
Case Management Note  Patient Details  Name: Vanessa Salazar MRN: [id number] Date of Birth: 11-Mar-1956  Subjective/Objective:   Spanish speaking patient. Met with her and family at bedside. Gave information on [facility] for pelvic fracture. Patient lives at home alone. Sustained a fall while cleaning her garage. Will get recommended DME after PT evaluation. Informed Corene Cornea with advanced that patient will most likely need a walker. Notified Zandra with patient pathways that this patient was a dialysis patient. She will take care of any dialysis communication/needs.                  Action/Plan: Following progression  Expected Discharge Date:                  Expected Discharge Plan:     In-House Referral:     Discharge [facility]  Post Acute Care Choice:    Choice offered to:     DME Arranged:    DME Agency:     HH Arranged:    [place] Agency:     Status of Service:  Completed, signed off  If discussed at [employer] of Stay Meetings, dates discussed:    Additional Comments:  Jolly Mango, RN 08/26/2016, 3:28 PM

## 2016-08-26 NOTE — Progress Notes (Signed)
HD completed without issue. No UF removed per order.

## 2016-08-26 NOTE — Progress Notes (Addendum)
Hypoglycemic Event  CBG: 43  Treatment: 15 GM carbohydrate snack  Symptoms: None  Follow-up CBG: Time: 2151    CBG Result: 57  Possible Reasons for Event: Inadequate meal intake and Change in activity, dialysis done today  Comments/MD notified: Hypoglycemic protocol implemented    [name], Pincus SanesAnessa Mae C, RN

## 2016-08-26 NOTE — Progress Notes (Signed)
Verbal order MD Luberta MutterKonidena to change 5 mg oxycodone to 5-10 mg Q4 PRN. Order placed.

## 2016-08-26 NOTE — Progress Notes (Signed)
HD intiatied via L AVF without issue. Interpreter accompanied patient to unit. Patient does have pain. Notified to let staff know if increases. Currently resting without complaints. Continue to monitor.

## 2016-08-26 NOTE — Progress Notes (Signed)
Post HD assessment unchanged  

## 2016-08-26 NOTE — Progress Notes (Signed)
PT Cancellation Note  Patient Details Name: Vanessa Salazar MRN: [id number] DOB: Jan 21, 1956   Cancelled Treatment:    Reason Eval/Treat Not Completed: Medical issues which prohibited therapy; MD ordered orthopedic consult to evaluate for any occult fracture of the right hip.  Will defer PT evaluation to a future date and time pending result of ortho consult.   Thomes Dinningavid Scott Obbie Lewallen 08/26/2016, 2:19 PM

## 2016-08-26 NOTE — Consult Note (Signed)
Date: 08/26/2016                  Patient Name:  Vanessa Salazar  MRN: [id number]  DOB: 16-Feb-1956  Age / Sex: 61 y.o., female         PCP: No PCP Per Patient                 Service Requesting Consult: Internal medicine                 Reason for Consult: ESRD            History of Present Illness: Patient is a 61 y.o. female with medical problems of ESRD, diabetes, hypertension, anemia, TIA, who was admitted to [facility] on 08/25/2016 for evaluation of right hip pain after a fall.  She was diagnosed with right pelvic fracture.  Currently getting pain management  Nephrology team has been asked to evaluate and then arrange for dialysis. Patient is followed by [facility] nephrology  Patient seen during dialysis Tolerating well    HEMODIALYSIS FLOWSHEET:  Blood Flow Rate (mL/min): 400 mL/min Arterial Pressure (mmHg): -130 mmHg Venous Pressure (mmHg): 140 mmHg Transmembrane Pressure (mmHg): 60 mmHg Ultrafiltration Rate (mL/min): 140 mL/min Dialysate Flow Rate (mL/min): 800 ml/min Conductivity: Machine : 14 Conductivity: Machine : 14 Dialysis Fluid Bolus: Normal Saline Bolus Amount (mL): 250 mL Dialysate Change: 2K Intra-Hemodialysis Comments: Rinseback. removed     Medications: Outpatient medications: Prescriptions Prior to Admission  Medication Sig Dispense Refill Last Dose  . acetaminophen (TYLENOL) 325 MG tablet Take 650 mg by mouth every 6 (six) hours as needed.   08/25/2016 at Unknown time  . amLODipine (NORVASC) 10 MG tablet Take 10 mg by mouth daily.   08/25/2016 at Unknown time  . atorvastatin (LIPITOR) 20 MG tablet Take 20 mg by mouth every evening.   08/25/2016 at Unknown time  . cinacalcet (SENSIPAR) 30 MG tablet Take 30 mg by mouth daily.   08/25/2016 at Unknown time  . furosemide (LASIX) 40 MG tablet Take 80 mg by mouth daily.   08/25/2016 at Unknown time  . hydrALAZINE (APRESOLINE) 100 MG tablet Take 100 mg by mouth 2 (two) times daily.   08/25/2016 at Unknown time  .  insulin NPH-regular Human (NOVOLIN 70/30) (70-30) 100 UNIT/ML injection Inject 8-10 Units into the skin 2 (two) times daily.   08/25/2016 at Unknown time  . OMEPRAZOLE PO Take 1 capsule by mouth daily.   08/25/2016 at Unknown time  . sevelamer (RENAGEL) 800 MG tablet Take 1,600 mg by mouth 4 (four) times daily as needed.   08/25/2016 at Unknown time    Current medications: Current Facility-Administered Medications  Medication Dose Route Frequency Provider Last Rate Last Dose  . acetaminophen (TYLENOL) tablet 650 mg  650 mg Oral Q6H PRN Alexis Hugelmeyer, DO       Or  . acetaminophen (TYLENOL) suppository 650 mg  650 mg Rectal Q6H PRN Alexis Hugelmeyer, DO      . albuterol (PROVENTIL) (2.5 MG/3ML) 0.083% nebulizer solution 2.5 mg  2.5 mg Nebulization Q6H PRN Alexis Hugelmeyer, DO      . amLODipine (NORVASC) tablet 10 mg  10 mg Oral Daily Alexis Hugelmeyer, DO   10 mg at 08/26/16 1457  . atorvastatin (LIPITOR) tablet 20 mg  20 mg Oral QPM Alexis Hugelmeyer, DO      . bisacodyl (DULCOLAX) EC tablet 5 mg  5 mg Oral Daily PRN Alexis Hugelmeyer, DO      .  cinacalcet (SENSIPAR) tablet 30 mg  30 mg Oral Q breakfast Alexis Hugelmeyer, DO      . docusate sodium (COLACE) capsule 100 mg  100 mg Oral BID Katha Hamming, MD   100 mg at 08/26/16 1516  . furosemide (LASIX) tablet 80 mg  80 mg Oral Daily Alexis Hugelmeyer, DO   80 mg at 08/26/16 1457  . heparin injection 5,000 Units  5,000 Units Subcutaneous Q8H Alexis Hugelmeyer, DO   5,000 Units at 08/26/16 1516  . hydrALAZINE (APRESOLINE) tablet 100 mg  100 mg Oral BID Alexis Hugelmeyer, DO   100 mg at 08/26/16 1456  . insulin aspart (novoLOG) injection 0-5 Units  0-5 Units Subcutaneous QHS Alexis Hugelmeyer, DO   2 Units at 08/26/16 0148  . insulin aspart (novoLOG) injection 0-9 Units  0-9 Units Subcutaneous TID WC Alexis Hugelmeyer, DO   3 Units at 08/26/16 0904  . insulin aspart protamine- aspart (NOVOLOG MIX 70/30) injection 8 Units  8 Units Subcutaneous  BID WC Alexis Hugelmeyer, DO   8 Units at 08/26/16 0902  . ipratropium (ATROVENT) nebulizer solution 0.5 mg  0.5 mg Nebulization Q6H PRN Alexis Hugelmeyer, DO      . magnesium citrate solution 1 Bottle  1 Bottle Oral Once PRN Alexis Hugelmeyer, DO      . morphine 2 MG/ML injection 2 mg  2 mg Intravenous Q4H PRN Katha Hamming, MD   2 mg at 08/26/16 1449  . ondansetron (ZOFRAN) tablet 4 mg  4 mg Oral Q6H PRN Alexis Hugelmeyer, DO       Or  . ondansetron (ZOFRAN) injection 4 mg  4 mg Intravenous Q6H PRN Alexis Hugelmeyer, DO      . oxyCODONE (Oxy IR/ROXICODONE) immediate release tablet 5-10 mg  5-10 mg Oral Q4H PRN Katha Hamming, MD   10 mg at 08/26/16 1305  . pantoprazole (PROTONIX) EC tablet 40 mg  40 mg Oral Daily Alexis Hugelmeyer, DO   40 mg at 08/26/16 1459  . senna-docusate (Senokot-S) tablet 1 tablet  1 tablet Oral QHS PRN Alexis Hugelmeyer, DO      . sevelamer carbonate (RENVELA) tablet 1,600 mg  1,600 mg Oral TID WC & HS Alexis Hugelmeyer, DO   1,600 mg at 08/26/16 1456      Allergies: No Known Allergies    Past Medical History: Past Medical History:  Diagnosis Date  . Diabetes mellitus without complication   . Hypertension      Past Surgical History: Past Surgical History:  Procedure Laterality Date  . CESAREAN SECTION     X3  . EYE SURGERY       Family History: No family history on file.   Social History: Social History   Social History  . Marital status: Single    Spouse name: N/A  . Number of children: N/A  . Years of education: N/A   Occupational History  . Not on file.   Social History Main Topics  . Smoking status: Never Smoker  . Smokeless tobacco: Never Used  . Alcohol use No  . Drug use: No  . Sexual activity: Yes   Other Topics Concern  . Not on file   Social History Narrative  . No narrative on file     Review of Systems:is limited due to patient complaining of pain Gen:  HEENT:  CV:  Resp:  GI: GU :  MS:  Derm:    Psych: Heme:  Neuro:  Endocrine  Vital Signs: Blood pressure (!) 184/68, pulse 80,  temperature 98.4 F (36.9 C), temperature source Oral, resp. rate 15, height 5\' 2"  (1.575 m), weight 64.9 kg (143 lb), last menstrual period 06/24/2005, SpO2 100 %.   Intake/Output Summary (Last 24 hours) at 08/26/16 1717 Last data filed at 08/26/16 1355  Gross per 24 hour  Intake                0 ml  Output                0 ml  Net                0 ml    Weight trends: Filed Weights   08/26/16 0113 08/26/16 1014 08/26/16 1355  Weight: 58.2 kg (128 lb 6.4 oz) 59.1 kg (130 lb 4.8 oz) 64.9 kg (143 lb)    Physical Exam: General: mild distress from pain  HEENT Anicteric, moist oral mucous membranes  Neck:  supple  Lungs: Normal breathing effort  Heart::  regular, no rub  Abdomen: Soft, nontender  Extremities:  trace edema  Neurologic: Alert, able to follow commands  Skin: No acute rashes             Lab results: Basic Metabolic Panel:  Recent Labs Lab 08/25/16 2204 08/26/16 0406  NA 137 137  K 5.8* 5.8*  CL 103 104  CO2 20* 22  GLUCOSE 270* 188*  BUN 57* 62*  CREATININE 8.75* 9.22*  CALCIUM 9.1 8.5*    Liver Function Tests:  Recent Labs Lab 08/26/16 0406  AST 20  ALT 11*  ALKPHOS 115  BILITOT 0.6  PROT 6.2*  ALBUMIN 3.7   No results for input(s): LIPASE, AMYLASE in the last 168 hours. No results for input(s): AMMONIA in the last 168 hours.  CBC:  Recent Labs Lab 08/25/16 2204 08/26/16 0406  WBC 5.2 5.4  NEUTROABS 4.4  --   HGB 9.0* 7.9*  HCT 27.5* 23.3*  MCV 99.6 99.7  PLT 136* 112*    Cardiac Enzymes: No results for input(s): CKTOTAL, TROPONINI in the last 168 hours.  BNP: Invalid input(s): POCBNP  CBG:  Recent Labs Lab 08/26/16 0116 08/26/16 0719 08/26/16 1439 08/26/16 1620  GLUCAP 224* 208* 82 126*    Microbiology: No results found for this or any previous visit (from the past 720 hour(s)).   Coagulation Studies: No results for  input(s): LABPROT, INR in the last 72 hours.  Urinalysis: No results for input(s): COLORURINE, LABSPEC, PHURINE, GLUCOSEU, HGBUR, BILIRUBINUR, KETONESUR, PROTEINUR, UROBILINOGEN, NITRITE, LEUKOCYTESUR in the last 72 hours.  Invalid input(s): APPERANCEUR      Imaging: Dg Hip Unilat W Or Wo Pelvis 2-3 Views Right  Result Date: 08/25/2016 CLINICAL DATA:  Right hip and inner thigh pain. Unable to bear weight after fall. EXAM: DG HIP (WITH OR WITHOUT PELVIS) 2-3V RIGHT COMPARISON:  None. FINDINGS: There is an acute minimally displaced fracture of the right superior pubic ramus and parasymphysis. Fracture likely involves the inferior pubic ramus near the pubic symphysis given its subtle lucency. AVN of the right femoral head with flattening is noted. Slight joint space narrowing of both hips. The visualized lumbar spine is intact. 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 The iliac bones are maintained. Sclerotic foci of the left iliac bone and left femoral head are keeping with bone islands. IMPRESSION: 1. AVN of the right femoral head with slight flattening of the weight-bearing portion of the femoral head. 2. There is a right minimally displaced superior pubic ramus fracture with more  subtle lucency suspicious for fracture at the junction of the inferior pubic ramus and pubic symphysis. Electronically Signed   By: Tollie Ethavid  Kwon M.D.   On: 08/25/2016 21:13      Assessment & Plan: Pt is a 61 y.o.  female with ESRD, diabetes, hypertension, anemia, TIA, who [facility] for evaluation of right hip pain after a fall.   1.  End-stage renal disease We will arrange for dialysis for the patient while she is inpatient  2.  Hypertension Blood pressure is elevated Pain might be playing a role Continue home medications and assess response  3.  Anemia of chronic kidney disease Hemoglobin 7.9 We will obtain iron studies  4.  Hyperkalemia Expected to correct with dialysis

## 2016-08-26 NOTE — Progress Notes (Signed)
Pts BP 184/68, scheduled BP meds given at 1456 (see MAR). MD Luberta MutterKonidena notified. Orders received for IV Hydralazine 10 mg IV X1. Order placed.

## 2016-08-26 NOTE — Progress Notes (Signed)
[facility] [place] at [facility]   PATIENT NAME: Vanessa Salazar    MR#:  [id number]  DATE OF BIRTH:  06-12-1956  SUBJECTIVE: Admitted for fall, pelvic fracture. Having a lot of pain unable to move even in bed. Patient says that he she had no dizziness or chest pain. Sustained mechanical fall. She says pain is out of 10 out of 10 severe. Required IV morphine 10 minutes ago.   CHIEF COMPLAINT:   Chief Complaint  Patient presents with  . Fall  . Hip Pain    REVIEW OF SYSTEMS:   ROS CONSTITUTIONAL: No fever, fatigue or weakness.  EYES: No blurred or double vision.  EARS, NOSE, AND THROAT: No tinnitus or ear pain.  RESPIRATORY: No cough, shortness of breath, wheezing or hemoptysis.  CARDIOVASCULAR: No chest pain, orthopnea, edema.  GASTROINTESTINAL: No nausea, vomiting, diarrhea or abdominal pain.  GENITOURINARY: No dysuria, hematuria.  ENDOCRINE: No polyuria, nocturia,  HEMATOLOGY: No anemia, easy bruising or bleeding SKIN: No rash or lesion. MUSCULOSKELETAL: Right hip, difficulty with ambulation. NEUROLOGIC: No tingling, numbness, weakness.  PSYCHIATRY: No anxiety or depression.   DRUG ALLERGIES:  No Known Allergies  VITALS:  Blood pressure (!) 173/61, pulse 80, temperature 99 F (37.2 C), temperature source Oral, resp. rate 20, height 5\' 2"  (1.575 m), weight 58.2 kg (128 lb 6.4 oz), last menstrual period 06/24/2005, SpO2 98 %.  PHYSICAL EXAMINATION:  GENERAL:  61 y.o.-year-old patient lying in the bed with no acute distress. Overall ill-appearing female. Moaning in pain. EYES: Pupils equal, round, reactive to light and accommodation. No scleral icterus. Extraocular muscles intact.  HEENT: Head atraumatic, normocephalic. Oropharynx and nasopharynx clear.  NECK:  Supple, no jugular venous distention. No thyroid enlargement, no tenderness.  LUNGS: Normal breath sounds bilaterally, no wheezing, rales,rhonchi or crepitation. No use of accessory  muscles of respiration.  CARDIOVASCULAR: S1, S2 normal. No murmurs, rubs, or gallops.  ABDOMEN: Soft, nontender, nondistended. Bowel sounds present. No organomegaly or mass.  EXTREMITIES: Patient has tenderness to palpation over the right hip area, decreased range of motion secondary to pain in the right hip. NEUROLOGIC: Cranial nerves II through XII are intact. Muscle strength 5/5 in all extremities. Sensation intact. Gait not checked.  PSYCHIATRIC: The patient is alert and oriented x 3.  SKIN: No obvious rash, lesion, or ulcer.    LABORATORY PANEL:   CBC  Recent Labs Lab 08/26/16 0406  WBC 5.4  HGB 7.9*  HCT 23.3*  PLT 112*   ------------------------------------------------------------------------------------------------------------------  Chemistries   Recent Labs Lab 08/26/16 0406  NA 137  K 5.8*  CL 104  CO2 22  GLUCOSE 188*  BUN 62*  CREATININE 9.22*  CALCIUM 8.5*  AST 20  ALT 11*  ALKPHOS 115  BILITOT 0.6   ------------------------------------------------------------------------------------------------------------------  Cardiac Enzymes No results for input(s): TROPONINI in the last 168 hours. ------------------------------------------------------------------------------------------------------------------  RADIOLOGY:  Dg Hip Unilat W Or Wo Pelvis 2-3 Views Right  Result Date: 08/25/2016 CLINICAL DATA:  Right hip and inner thigh pain. Unable to bear weight after fall. EXAM: DG HIP (WITH OR WITHOUT PELVIS) 2-3V RIGHT COMPARISON:  None. FINDINGS: There is an acute minimally displaced fracture of the right superior pubic ramus and parasymphysis. Fracture likely involves the inferior pubic ramus near the pubic symphysis given its subtle lucency. AVN of the right femoral head with flattening is noted. Slight joint space narrowing of both hips. The visualized lumbar spine is intact. 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 The iliac  bones are maintained. Sclerotic  foci of the left iliac bone and left femoral head are keeping with bone islands. IMPRESSION: 1. AVN of the right femoral head with slight flattening of the weight-bearing portion of the femoral head. 2. There is a right minimally displaced superior pubic ramus fracture with more subtle lucency suspicious for fracture at the junction of the inferior pubic ramus and pubic symphysis. Electronically Signed   By: Tollie Ethavid  Kwon M.D.   On: 08/25/2016 21:13    EKG:   Orders placed or performed during the hospital encounter of 08/25/16  . EKG 12-Lead    ASSESSMENT AND PLAN:   #1. right superior pubic ramus fracture, suspicious fracture at the inferior pubic pubic ramus  and pubic symphysis'. Patient having significant pain in the right hip area with the pain even at rest, x-ray also is questionable for flattening of right femoral head: I Will consult orthopedic to see if she needs a CT of the right hip to evaluate for any occult fracture of the right hip.  Continue IV morphine milligram every 4 hours and oxycodone 5 mg every 4 hours when necessary, physical therapy consult.  #2 ESRD on hemodialysis Monday, Wednesday, Friday, consult nephrology.  .#3 essential hypertension; BP is slightly elevated secondary to pain: Continue present BP medicines.  #4. Diabetes mellitus type 2; continue present insulin at 70/30, SSI.  5 GI and DVT prophylaxis.  Used  the [place] interpreter. Discussed with patient, discussed with nurse.   All the records are reviewed and case discussed with Care Management/Social Workerr. Management plans discussed with the patient, family and they are in agreement.  CODE STATUS: full  TOTAL TIME TAKING CARE OF THIS PATIENT: 35 minutes.   POSSIBLE D/C IN 2-3 DAYS, DEPENDING ON CLINICAL CONDITION.   Katha HammingKONIDENA,Wilburn Keir M.D on 08/26/2016 at 8:54 AM  Between 7am to 6pm - Pager - [phone]  After 6pm go to [email or web address] - password [email or web address] [facility]  [facility]   Office  [phone]  CC: Primary care physician; No PCP Per Patient   Note: This dictation was prepared with Dragon dictation along with smaller phrase technology. Any transcriptional errors that result from this process are unintentional.

## 2016-08-26 NOTE — Progress Notes (Signed)
Hypoglycemic Event  CBG: 57  Treatment: 15 GM gel  Symptoms: None  Follow-up CBG: Time: 2230   CBG Result: 85  Possible Reasons for Event: Inadequate meal intake and Change in activity, dialysis done today  Comments/MD notified: Hypoglycemic protocol implemented    Vanessa Salazar, Pincus SanesAnessa Mae C, RN

## 2016-08-27 ENCOUNTER — Inpatient Hospital Stay: Payer: Medicaid Other

## 2016-08-27 LAB — GLUCOSE, CAPILLARY
GLUCOSE-CAPILLARY: 118 mg/dL — AB (ref 65–99)
GLUCOSE-CAPILLARY: 333 mg/dL — AB (ref 65–99)
Glucose-Capillary: 43 mg/dL — CL (ref 65–99)
Glucose-Capillary: 146 mg/dL — ABNORMAL HIGH (ref 65–99)
Glucose-Capillary: 153 mg/dL — ABNORMAL HIGH (ref 65–99)

## 2016-08-27 LAB — INFLUENZA PANEL BY PCR (TYPE A & B)
INFLAPCR: NEGATIVE
INFLBPCR: NEGATIVE

## 2016-08-27 LAB — HIV ANTIBODY (ROUTINE TESTING W REFLEX): HIV SCREEN 4TH GENERATION: NONREACTIVE

## 2016-08-27 LAB — HEPATITIS B SURFACE ANTIGEN: HEP B S AG: NEGATIVE

## 2016-08-27 MED ORDER — DEXTROSE 5 % IV SOLN
1.0000 g | Freq: Every day | INTRAVENOUS | Status: DC
  Administered 2016-08-27: 1 g via INTRAVENOUS
  Filled 2016-08-27 (×2): qty 10

## 2016-08-27 MED ORDER — NEPRO/CARBSTEADY PO LIQD
237.0000 mL | Freq: Two times a day (BID) | ORAL | Status: DC
  Administered 2016-08-27 – 2016-09-02 (×7): 237 mL via ORAL

## 2016-08-27 NOTE — Progress Notes (Signed)
Pts temp is 101.3, PRN Tylenol given.  MD Luberta MutterKonidena paged. MD placing orders.

## 2016-08-27 NOTE — Progress Notes (Signed)
Inpatient Diabetes Program Recommendations  AACE/ADA: New Consensus Statement on Inpatient Glycemic Control (2015)  Target Ranges:  Prepandial:   less than 140 mg/dL      Peak postprandial:   less than 180 mg/dL (1-2 hours)      Critically ill patients:  140 - 180 mg/dL   Lab Results  Component Value Date   GLUCAP 118 (H) 08/27/2016    Review of Glycemic Control:  Results for Vanessa ShellingMEDINA Salazar, Syra D (MRN [id number]) as of 08/27/2016 09:48  Ref. Range 08/26/2016 01:16 08/26/2016 07:19 08/26/2016 14:39 08/26/2016 16:20 08/26/2016 21:22 08/26/2016 21:24 08/26/2016 21:51 08/26/2016 22:30 08/27/2016 07:18  Glucose-Capillary Latest Ref Range: 65 - 99 mg/dL 829224 (H) 562208 (H) 82 130126 (H) 43 (LL) 42 (LL) 57 (L) 85 118 (H)    Diabetes history:  Type 2 diabetes  Outpatient Diabetes medications: 70/30 8-10 units bid Current orders for Inpatient glycemic control:  Novolog 70/30 8 units bid, Novolog sensitive tid with meals and HS  Inpatient Diabetes Program Recommendations:    Consider reduction of Novolog 70/30 to 6 units bid.  Also may consider reduction of Novolog correction scale to custom scale that starts at 151 mg/dL- [phone] mg/dL- 1 unit, [phone] mg/dL- 2 units- 284-132251-300 mg/dL- 3 units, [phone] mg/dL- 4 units, 725-366351-400 mg/dL- 5 units.  Thanks, Beryl MeagerJenny Nilo Fallin, RN, BC-ADM Inpatient Diabetes Coordinator Pager [phone] (8a-5p)

## 2016-08-27 NOTE — Care Management Note (Signed)
Case Management Note  Patient Details  Name: Vanessa Salazar MRN: [id number] Date of Birth: Oct 06, 1955  Subjective/Objective:  Ordered walker and bsc from [place] with advanced through charity program.                   Action/Plan:   Expected Discharge Date:                  Expected Discharge Plan:     In-House Referral:  Clinical Social Work  Discharge planning Services  CM Consult  Post Acute Care Choice:  Durable Medical Equipment Choice offered to:     DME Arranged:  Walker rolling, Bedside commode DME Agency:  [facility]  HH Arranged:    HH Agency:     Status of Service:  In process, will continue to follow  If discussed at Long Length of Stay Meetings, dates discussed:    Additional Comments:  Marily MemosLisa M Shainna Faux, RN 08/27/2016, 2:12 PM

## 2016-08-27 NOTE — Progress Notes (Signed)
Subjective:   Doing fair today Working with PT Appetite is poor No SOB No N/V Low grade fever Interview with spanish interpreter  Objective:  Vital signs in last 24 hours:  Temp:  [99.6 F (37.6 C)-101.3 F (38.5 C)] 99.7 F (37.6 C) (03/06 1521) Pulse Rate:  [79-99] 95 (03/06 1521) Resp:  [16-19] 16 (03/06 1330) BP: (149-186)/(51-68) 149/56 (03/06 1521) SpO2:  [96 %-97 %] 97 % (03/06 1521)  Weight change: 4.104 kg (9 lb 0.8 oz) Filed Weights   08/26/16 0113 08/26/16 1014 08/26/16 1355  Weight: 58.2 kg (128 lb 6.4 oz) 59.1 kg (130 lb 4.8 oz) 64.9 kg (143 lb)    Intake/Output:    Intake/Output Summary (Last 24 hours) at 08/27/16 1528 Last data filed at 08/27/16 1200  Gross per 24 hour  Intake             1040 ml  Output                0 ml  Net             1040 ml     Physical Exam: General: Laying in bed, NAD  HEENT anicteric  Neck supple  Pulm/lungs Coarse at bases, no crackles  CVS/Heart No rub or gallop  Abdomen:  Soft, NT  Extremities: Trace edema  Neurologic: Alert, able to follow commands  Skin: Warm, dry  Access: AVF        Basic Metabolic Panel:   Recent Labs Lab 08/25/16 2204 08/26/16 0406  NA 137 137  K 5.8* 5.8*  CL 103 104  CO2 20* 22  GLUCOSE 270* 188*  BUN 57* 62*  CREATININE 8.75* 9.22*  CALCIUM 9.1 8.5*     CBC:  Recent Labs Lab 08/25/16 2204 08/26/16 0406  WBC 5.2 5.4  NEUTROABS 4.4  --   HGB 9.0* 7.9*  HCT 27.5* 23.3*  MCV 99.6 99.7  PLT 136* 112*      Microbiology:  No results found for this or any previous visit (from the past 720 hour(s)).  Coagulation Studies: No results for input(s): LABPROT, INR in the last 72 hours.  Urinalysis: No results for input(s): COLORURINE, LABSPEC, PHURINE, GLUCOSEU, HGBUR, BILIRUBINUR, KETONESUR, PROTEINUR, UROBILINOGEN, NITRITE, LEUKOCYTESUR in the last 72 hours.  Invalid input(s): APPERANCEUR    Imaging: Dg Chest 1 View  Result Date: 08/27/2016 CLINICAL DATA:   Fevers EXAM: CHEST 1 VIEW COMPARISON:  09/09/2011 FINDINGS: Cardiac shadow is enlarged. Vascular congestion is noted with findings suggestive of a right-sided pleural effusion. No focal confluent infiltrate is seen. No bony abnormality is noted. IMPRESSION: Changes consistent with CHF and right pleural effusion. No focal infiltrate is noted. Electronically Signed   By: Alcide Clever M.D.   On: 08/27/2016 14:50   Dg Hip Unilat W Or Wo Pelvis 2-3 Views Right  Result Date: 08/25/2016 CLINICAL DATA:  Right hip and inner thigh pain. Unable to bear weight after fall. EXAM: DG HIP (WITH OR WITHOUT PELVIS) 2-3V RIGHT COMPARISON:  None. FINDINGS: There is an acute minimally displaced fracture of the right superior pubic ramus and parasymphysis. Fracture likely involves the inferior pubic ramus near the pubic symphysis given its subtle lucency. AVN of the right femoral head with flattening is noted. Slight joint space narrowing of both hips. The visualized lumbar spine is intact. 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 The iliac bones are maintained. Sclerotic foci of the left iliac bone and left femoral head are keeping with bone islands. IMPRESSION: 1.  AVN of the right femoral head with slight flattening of the weight-bearing portion of the femoral head. 2. There is a right minimally displaced superior pubic ramus fracture with more subtle lucency suspicious for fracture at the junction of the inferior pubic ramus and pubic symphysis. Electronically Signed   By: Tollie Ethavid  Kwon M.D.   On: 08/25/2016 21:13     Medications:    . amLODipine  10 mg Oral Daily  . atorvastatin  20 mg Oral QPM  . cefTRIAXone (ROCEPHIN)  IV  1 g Intravenous q1800  . cinacalcet  30 mg Oral Q breakfast  . docusate sodium  100 mg Oral BID  . furosemide  80 mg Oral Daily  . heparin  5,000 Units Subcutaneous Q8H  . hydrALAZINE  100 mg Oral BID  . insulin aspart  0-5 Units Subcutaneous QHS  . insulin aspart  0-9 Units Subcutaneous TID WC   . pantoprazole  40 mg Oral Daily  . sevelamer carbonate  1,600 mg Oral TID WC & HS   acetaminophen **OR** acetaminophen, albuterol, bisacodyl, ipratropium, magnesium citrate, morphine injection, ondansetron **OR** ondansetron (ZOFRAN) IV, oxyCODONE, senna-docusate  Assessment/ Plan:  61 y.o. female with ESRD, diabetes, hypertension, anemia, TIA, who [facility] for evaluation of right hip pain after a fall.   [address], MWF, 3 hrs, [facility]  1.  End-stage renal disease We will arrange for dialysis for the patient while she is inpatient Next HD tomorrow  2.  Hypertension Blood pressure is elevated Pain might be playing a role Continue home medications and assess response  3.  Anemia of chronic kidney disease Hemoglobin 7.9 We will obtain iron studies  4.  Hyperkalemia Expected to correct with dialysis   LOS: 2 Vanessa Salazar 3/6/20183:28 PM

## 2016-08-27 NOTE — Progress Notes (Signed)
[facility] - [place] at [facility]   PATIENT NAME: Vanessa Salazar    MR#:  [id number]  DATE OF BIRTH:  04/04/1956  SUBJECTIVE: Admitted for fall, pelvic fracture.still having lot of pain ,PT recommends SNF.spiking  Fever today.no sob.nausea ,poor po intake.low BG .  CHIEF COMPLAINT:   Chief Complaint  Patient presents with  . Fall  . Hip Pain    REVIEW OF SYSTEMS:   ROS CONSTITUTIONAL:  Fever today., fatigue or weakness.  EYES: No blurred or double vision.  EARS, NOSE, AND THROAT: No tinnitus or ear pain.  RESPIRATORY: No cough, shortness of breath, wheezing or hemoptysis.  CARDIOVASCULAR: No chest pain, orthopnea, edema.  GASTROINTESTINAL: has  nausea,  No vomiting, diarrhea or abdominal pain.  GENITOURINARY:Hd patient does not make any urine. ENDOCRINE: No polyuria, nocturia,  HEMATOLOGY: No anemia, easy bruising or bleeding SKIN: No rash or lesion. MUSCULOSKELETAL: right pelvic pain, difficulty with ambulation. NEUROLOGIC: No tingling, numbness, weakness.  PSYCHIATRY: No anxiety or depression.   DRUG ALLERGIES:  No Known Allergies  VITALS:  Blood pressure (!) 149/56, pulse 95, temperature 99.7 F (37.6 C), temperature source Oral, resp. rate 16, height 5\' 2"  (1.575 m), weight 64.9 kg (143 lb), last menstrual period 06/24/2005, SpO2 97 %.  PHYSICAL EXAMINATION:  GENERAL:  61 y.o.-year-old patient lying in the bed with no acute distress. Overall ill-appearing female. Moaning in pain. EYES: Pupils equal, round, reactive to light and accommodation. No scleral icterus. Extraocular muscles intact.  HEENT: Head atraumatic, normocephalic. Oropharynx and nasopharynx clear.  NECK:  Supple, no jugular venous distention. No thyroid enlargement, no tenderness.  LUNGS: Normal breath sounds bilaterally, no wheezing, rales,rhonchi or crepitation. No use of accessory muscles of respiration.  CARDIOVASCULAR: S1, S2 normal. No murmurs, rubs, or gallops.   ABDOMEN: Soft, nontender, nondistended. Bowel sounds present. No organomegaly or mass.  EXTREMITIES: Patient has tenderness to palpation over the right hip area, decreased range of motion secondary to pain in the right hip. NEUROLOGIC: Cranial nerves II through XII are intact. Muscle strength 5/5 in all extremities. Sensation intact. Gait not checked.  PSYCHIATRIC: The patient is alert and oriented x 3.  SKIN: No obvious rash, lesion, or ulcer.    LABORATORY PANEL:   CBC  Recent Labs Lab 08/26/16 0406  WBC 5.4  HGB 7.9*  HCT 23.3*  PLT 112*   ------------------------------------------------------------------------------------------------------------------  Chemistries   Recent Labs Lab 08/26/16 0406  NA 137  K 5.8*  CL 104  CO2 22  GLUCOSE 188*  BUN 62*  CREATININE 9.22*  CALCIUM 8.5*  AST 20  ALT 11*  ALKPHOS 115  BILITOT 0.6   ------------------------------------------------------------------------------------------------------------------  Cardiac Enzymes No results for input(s): TROPONINI in the last 168 hours. ------------------------------------------------------------------------------------------------------------------  RADIOLOGY:  Dg Chest 1 View  Result Date: 08/27/2016 CLINICAL DATA:  Fevers EXAM: CHEST 1 VIEW COMPARISON:  09/09/2011 FINDINGS: Cardiac shadow is enlarged. Vascular congestion is noted with findings suggestive of a right-sided pleural effusion. No focal confluent infiltrate is seen. No bony abnormality is noted. IMPRESSION: Changes consistent with CHF and right pleural effusion. No focal infiltrate is noted. Electronically Signed   By: Alcide Clever M.D.   On: 08/27/2016 14:50   Dg Hip Unilat W Or Wo Pelvis 2-3 Views Right  Result Date: 08/25/2016 CLINICAL DATA:  Right hip and inner thigh pain. Unable to bear weight after fall. EXAM: DG HIP (WITH OR WITHOUT PELVIS) 2-3V RIGHT COMPARISON:  None. FINDINGS: There is an acute minimally  displaced  fracture of the right superior pubic ramus and parasymphysis. Fracture likely involves the inferior pubic ramus near the pubic symphysis given its subtle lucency. AVN of the right femoral head with flattening is noted. Slight joint space narrowing of both hips. The visualized lumbar spine is intact. 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 The iliac bones are maintained. Sclerotic foci of the left iliac bone and left femoral head are keeping with bone islands. IMPRESSION: 1. AVN of the right femoral head with slight flattening of the weight-bearing portion of the femoral head. 2. There is a right minimally displaced superior pubic ramus fracture with more subtle lucency suspicious for fracture at the junction of the inferior pubic ramus and pubic symphysis. Electronically Signed   By: Tollie Ethavid  Kwon M.D.   On: 08/25/2016 21:13    EKG:   Orders placed or performed during the hospital encounter of 08/25/16  . EKG 12-Lead    ASSESSMENT AND PLAN:   #1. right superior pubic ramus fracture,  fracture at the inferior pubic pubic ramus  and pubic symphysis'. Patient having significant pain in the right hip area.seen by ortho ,conservative management advised.seen by PT .recommended SNF.c  Continue IV morphine milligram every 4 hours and oxycodone 5 mg every 4 hours when necessary,  #2 ESRD on hemodialysis Monday, Wednesday, Friday, has hyperkalemia.nephro  On board.  .#3 essential hypertension; BP is slightly elevated secondary to pain: Continue present BP medicines.  #4. Diabetes mellitus type 2; hypoglycemic episode.decreased her novolin  70/30.has poor po intake.  5 .fever;chest xray showed pleural effusion an CHF.neohro following for her HD.follow blood culture results,started empirically with rocephin.influenza test is negative.  Used  the [place] interpreter. Discussed with patient, discussed with nurse. Dispo;;SNF  All the records are reviewed and case discussed with Care  Management/Social Workerr. Management plans discussed with the patient, family and they are in agreement.  CODE STATUS: full  TOTAL TIME TAKING CARE OF THIS PATIENT: 35 minutes.   POSSIBLE D/C IN 2-3 DAYS, DEPENDING ON CLINICAL CONDITION.   Katha HammingKONIDENA,Pablo Mathurin M.D on 08/27/2016 at 4:16 PM  Between 7am to 6pm - Pager - [phone]  After 6pm go to [email or web address] - password [email or web address] [facility]  [facility] [place] [facility]  Office  [phone]  CC: Primary care physician; No PCP Per Patient   Note: This dictation was prepared with Dragon dictation along with smaller phrase technology. Any transcriptional errors that result from this process are unintentional.

## 2016-08-27 NOTE — Clinical Social Work Note (Signed)
Clinical Social Work Assessment  Patient Details  Name: Vanessa Salazar MRN: [id number] Date of Birth: Mar 27, 1956  Date of referral:  08/27/16               Reason for consult:  [employer], Discharge Planning                Permission sought to share information with:  Case Manager Permission granted to share information::     Name::        Agency::     Relationship::     Contact Information:     Housing/Transportation Living arrangements for the past 2 months:  [place] of Information:  Patient, Other (Comment Required) Therapist, music ) Patient Interpreter Needed:  Spanish Criminal Activity/Legal Involvement Pertinent to Current Situation/Hospitalization:  No - Comment as needed Significant Relationships:  Adult Children, Warehouse manager Lives with:  Self Do you feel safe going back to the place where you live?  Yes Need for family participation in patient care:  Yes (Comment)  Care giving concerns:  Patient lives alone in [place].    Social Worker assessment / plan:  Holiday representative (CSW) received verbal consult from PT that recommending is SNF. Patient has no insurance. CSW discussed case with CSW director who reported that an LOG is not appropriate and stated that PT needs to see patient as often as they can while she is at [facility]. CSW asked PT to see patient as often as they can. CSW met with patient and her pastor, pastor's wife and a church member at bedside. CSW utilized a Administrator, sports to complete assessment. Per patient she lives alone in [place] and has 3 adult children. Per patient 1 child lives in [place] and 2 of her children live in [place]. Per patient her children are grown and have families of their own and can't provide daily support for her. Patient reported that she goes to dialysis at 6 am M,W,F at [facility] on [address] in [place] and uses Scientist, research (physical sciences) for transport. Patient reported that she has emergency medicaid  only for dialysis and goes [facility]. CSW explained that patient will have limited options for home health and SNF because she has no insurance. Patient reported that her church community can assist her some but she will be on her own for the most part. CSW provided patient with a private duty sitter list. Plan is for patient to D/C home. RN case manager aware of above. CSW will continue to follow and assist as needed.    Employment status:  Disabled (Comment on whether or not currently receiving Disability) Insurance information:  Self Pay (Medicaid Pending) PT Recommendations:  [place] / Referral to community resources:  Other (Comment Required) (Private duty sitter list )  Patient/Family's Response to care:  Patient is agreeable to going home.   Patient/Family's Understanding of and Emotional Response to Diagnosis, Current Treatment, and Prognosis:  Patient and her church community her were very pleasant and thanked CSW for visit.   Emotional Assessment Appearance:  Appears stated age Attitude/Demeanor/Rapport:    Affect (typically observed):  Accepting, Adaptable, Pleasant Orientation:  Oriented to Self, Oriented to Place, Oriented to  Time, Oriented to Situation Alcohol / Substance use:  Not Applicable Psych involvement (Current and /or in the community):  No (Comment)  Discharge Needs  Concerns to be addressed:  Discharge Planning Concerns Readmission within the last 30 days:  No Current discharge  risk:  Dependent with Mobility Barriers to Discharge:  Continued Medical Work up   [employer], Veronia Beets, LCSW 08/27/2016, 2:03 PM

## 2016-08-27 NOTE — Evaluation (Signed)
Physical Therapy Evaluation Patient Details Name: Vanessa Salazar MRN: [id number] DOB: 1955/09/21 Today's Date: 08/27/2016   History of Present Illness  61 y.o. female with a known history of DM, HTN, ESRD on HD, anemia, TIA here after fall at home suffering R pubic ramus fx, X-ray also shows some avascular necrosis.    Clinical Impression  Pt initially very hesitant to try any mobility secondary to pain but with some heavy cuing/encouragement (via interpreter) she did agree to try and get up.  She had a lot of pain with bilateral hip movement (almost appeared more on the L) and needed time to rest and calm between reps/exercises with ~12 minutes of supine exercises apart from the exam.  Pt reports she lives alone and does not have anyone that could help her, but she does appear to have some sort of support system.  Pt showed good effort t/o PT exam, but ultimately is so limited by pain that she is not able to do much.     Follow Up Recommendations SNF    Equipment Recommendations  Rolling walker with 5" wheels    Recommendations for Other Services       Precautions / Restrictions Precautions Precautions: Fall Restrictions Weight Bearing Restrictions: Yes RLE Weight Bearing: Partial weight bearing Other Position/Activity Restrictions: must use assistive device      Mobility  Bed Mobility Overal bed mobility: Needs Assistance Bed Mobility: Supine to Sit;Sit to Supine     Supine to sit: Max assist Sit to supine: Max assist   General bed mobility comments: Pt hesitant to even try to get to sitting, PT gave heavy assist and despite some effort she was unable to truly assist.  Pt c/o severe pain in sitting - clearly uncomfortable in both hips  Transfers Overall transfer level: Needs assistance Equipment used: Rolling walker (2 wheeled) Transfers: Sit to/from Stand Sit to Stand: Max assist         General transfer comment: Pt was unable to achieve fully upright standing.   She showed good effort but simply had too much pain to get to fully upright despite considerable assist and encouragement  Ambulation/Gait             General Gait Details: unable to attempt secondary to pain  Stairs            Wheelchair Mobility    Modified Rankin (Stroke Patients Only)       Balance Overall balance assessment:  (uncomfortable sitting EOB but indep, unable to stand)                                           Pertinent Vitals/Pain Pain Assessment: 0-10 Pain Score: 8  (increases with movement) Pain Location: reports having as much pain on the L as the R    Home Living Family/patient expects to be discharged to::  (Pt needs rehab, unsure how feasible this is) Living Arrangements: Alone               Additional Comments: Pt reports that she does not have anyone that could help, stay with her, etc though it does appear she has children locally and an involved church family    Prior Function Level of Independence: Independent         Comments: Apparently she could be somewhat active, but was becoming more limited  Hand Dominance        Extremity/Trunk Assessment   Upper Extremity Assessment Upper Extremity Assessment: Generalized weakness;Overall WFL for tasks assessed    Lower Extremity Assessment Lower Extremity Assessment:  (severe pain with any hip mvt, limited AAROM only in hips)       Communication   Communication: Prefers language other than English (Maritza was the interpreter)  Cognition Arousal/Alertness: Awake/alert Behavior During Therapy:  (very guarded) Overall Cognitive Status: Within Functional Limits for tasks assessed                      General Comments      Exercises General Exercises - Lower Extremity Ankle Circles/Pumps: AROM;5 reps Short Arc Quad: Strengthening;10 reps;Both Heel Slides: AROM;AAROM;5 reps;Both Hip ABduction/ADduction: AAROM;5 reps;Both    Assessment/Plan    PT Assessment Patient needs continued PT services  PT Problem List Decreased strength;Decreased range of motion;Decreased activity tolerance;Decreased balance;Decreased mobility;Decreased knowledge of use of DME;Decreased safety awareness;Pain       PT Treatment Interventions DME instruction;Gait training;Stair training;Functional mobility training;Therapeutic exercise;Therapeutic activities;Balance training;Neuromuscular re-education;Patient/family education    PT Goals (Current goals can be found in the Care Plan section)  Acute Rehab PT Goals Patient Stated Goal: control the pain PT Goal Formulation: With patient Time For Goal Achievement: 09/10/16 Potential to Achieve Goals: Fair    Frequency 7X/week   Barriers to discharge Decreased caregiver support      Co-evaluation               End of Session Equipment Utilized During Treatment: Gait belt Activity Tolerance: Patient limited by pain Patient left: with bed alarm set;with call bell/phone within reach Nurse Communication: Mobility status PT Visit Diagnosis: Muscle weakness (generalized) (M62.81);Difficulty in walking, not elsewhere classified (R26.2);Pain Pain - Right/Left: Right Pain - part of body: Hip         Time: [id number] PT Time Calculation (min) (ACUTE ONLY): 45 min   Charges:   PT Evaluation $PT Eval Moderate Complexity: 1 Procedure PT Treatments $Therapeutic Exercise: 8-22 mins   PT G Codes:         Malachi ProGalen R Taneasha Fuqua, DPT 08/27/2016, 2:17 PM

## 2016-08-27 NOTE — Care Management (Addendum)
Case discussed with Barbara CowerJason with [facility]. Patient does not qualify for home health PT per diagnosis code and  guidelines. She ha limited family support and has no one that can help her. She takes [facility] to dialysis.

## 2016-08-27 NOTE — Care Management (Addendum)
[facility]  [address]. Patient active with [facility] for PCP and Medications.

## 2016-08-28 LAB — BASIC METABOLIC PANEL
ANION GAP: 14 (ref 5–15)
BUN: 45 mg/dL — AB (ref 6–20)
CALCIUM: 8.3 mg/dL — AB (ref 8.9–10.3)
CO2: 28 mmol/L (ref 22–32)
CREATININE: 6.62 mg/dL — AB (ref 0.44–1.00)
Chloride: 93 mmol/L — ABNORMAL LOW (ref 101–111)
GFR calc Af Amer: 7 mL/min — ABNORMAL LOW (ref >60)
GFR calc non Af Amer: 6 mL/min — ABNORMAL LOW (ref >60)
GLUCOSE: 158 mg/dL — AB (ref 65–99)
Potassium: 5.2 mmol/L — ABNORMAL HIGH (ref 3.5–5.1)
Sodium: 135 mmol/L (ref 135–145)

## 2016-08-28 LAB — GLUCOSE, CAPILLARY
GLUCOSE-CAPILLARY: 180 mg/dL — AB (ref 65–99)
Glucose-Capillary: 182 mg/dL — ABNORMAL HIGH (ref 65–99)
Glucose-Capillary: 130 mg/dL — ABNORMAL HIGH (ref 65–99)
Glucose-Capillary: 176 mg/dL — ABNORMAL HIGH (ref 65–99)

## 2016-08-28 LAB — CBC
HEMATOCRIT: 22.5 % — AB (ref 35.0–47.0)
Hemoglobin: 7.5 g/dL — ABNORMAL LOW (ref 12.0–16.0)
MCH: 32.9 pg (ref 26.0–34.0)
MCHC: 33.4 g/dL (ref 32.0–36.0)
MCV: 98.5 fL (ref 80.0–100.0)
Platelets: 83 10*3/uL — ABNORMAL LOW (ref 150–440)
RBC: 2.29 MIL/uL — ABNORMAL LOW (ref 3.80–5.20)
RDW: 18.8 % — AB (ref 11.5–14.5)
WBC: 4.6 10*3/uL (ref 3.6–11.0)

## 2016-08-28 NOTE — Progress Notes (Signed)
  HEMODIALYSIS FLOWSHEET:  Blood Flow Rate (mL/min): 400 mL/min Arterial Pressure (mmHg): -150 mmHg Venous Pressure (mmHg): 150 mmHg Transmembrane Pressure (mmHg): 60 mmHg Ultrafiltration Rate (mL/min): 430 mL/min Dialysate Flow Rate (mL/min): 800 ml/min Conductivity: Machine : 14 Conductivity: Machine : 14 Dialysis Fluid Bolus: Normal Saline Bolus Amount (mL): 250 mL (prime) Dialysate Change: 2K Intra-Hemodialysis Comments: 873. resting.    Patient seen during dialysis Tolerating well Denies acute c/o  Vitals:   08/28/16 1030 08/28/16 1100 08/28/16 1130 08/28/16 1200  BP: (!) 149/66 (!) 151/67 (!) 173/70 (!) 174/60  Pulse: 70 73 78 63  Resp: 11 18 11 12   Temp:      TempSrc:      SpO2: 100% 100% 100% 100%  Weight:      Height:

## 2016-08-28 NOTE — Progress Notes (Signed)
[facility] - [place] at [facility]   PATIENT NAME: Vanessa KindredMaria Medina Salazar    MR#:  [id number]  DATE OF BIRTH:  05-11-1956  SUBJECTIVE: Admitted for fall, pelvic fracture.still having lot of pain ,PT recommends SNF.spiking  Fever today.no sob.nausea ,poor po intake.low BG .  CHIEF COMPLAINT:   Chief Complaint  Patient presents with  . Fall  . Hip Pain    REVIEW OF SYSTEMS:   ROS CONSTITUTIONAL:  Fever today., fatigue or weakness.  EYES: No blurred or double vision.  EARS, NOSE, AND THROAT: No tinnitus or ear pain.  RESPIRATORY: No cough, shortness of breath, wheezing or hemoptysis.  CARDIOVASCULAR: No chest pain, orthopnea, edema.  GASTROINTESTINAL: has  nausea,  No vomiting, diarrhea or abdominal pain.  GENITOURINARY:Hd patient does not make any urine. ENDOCRINE: No polyuria, nocturia,  HEMATOLOGY: No anemia, easy bruising or bleeding SKIN: No rash or lesion. MUSCULOSKELETAL: right pelvic pain, difficulty with ambulation. NEUROLOGIC: No tingling, numbness, weakness.  PSYCHIATRY: No anxiety or depression.   DRUG ALLERGIES:  No Known Allergies  VITALS:  Blood pressure (!) 161/57, pulse 74, temperature 98.2 F (36.8 C), temperature source Oral, resp. rate 18, height 5\' 2"  (1.575 m), weight 58.9 kg (129 lb 13.6 oz), last menstrual period 06/24/2005, SpO2 97 %.  PHYSICAL EXAMINATION:  GENERAL:  61 y.o.-year-old patient lying in the bed with no acute distress. Overall ill-appearing female. Moaning in pain. EYES: Pupils equal, round, reactive to light and accommodation. No scleral icterus. Extraocular muscles intact.  HEENT: Head atraumatic, normocephalic. Oropharynx and nasopharynx clear.  NECK:  Supple, no jugular venous distention. No thyroid enlargement, no tenderness.  LUNGS: Normal breath sounds bilaterally, no wheezing, rales,rhonchi or crepitation. No use of accessory muscles of respiration.  CARDIOVASCULAR: S1, S2 normal. No murmurs, rubs, or  gallops.  ABDOMEN: Soft, nontender, nondistended. Bowel sounds present. No organomegaly or mass.  EXTREMITIES: Patient has tenderness to palpation over the right hip area, decreased range of motion secondary to pain in the right hip. NEUROLOGIC: Cranial nerves II through XII are intact. Muscle strength 5/5 in all extremities. Sensation intact. Gait not checked.  PSYCHIATRIC: The patient is alert and oriented x 3.  SKIN: No obvious rash, lesion, or ulcer.    LABORATORY PANEL:   CBC  Recent Labs Lab 08/28/16 0403  WBC 4.6  HGB 7.5*  HCT 22.5*  PLT 83*   ------------------------------------------------------------------------------------------------------------------  Chemistries   Recent Labs Lab 08/26/16 0406 08/28/16 0403  NA 137 135  K 5.8* 5.2*  CL 104 93*  CO2 22 28  GLUCOSE 188* 158*  BUN 62* 45*  CREATININE 9.22* 6.62*  CALCIUM 8.5* 8.3*  AST 20  --   ALT 11*  --   ALKPHOS 115  --   BILITOT 0.6  --    ------------------------------------------------------------------------------------------------------------------  Cardiac Enzymes No results for input(s): TROPONINI in the last 168 hours. ------------------------------------------------------------------------------------------------------------------  RADIOLOGY:  Dg Chest 1 View  Result Date: 08/27/2016 CLINICAL DATA:  Fevers EXAM: CHEST 1 VIEW COMPARISON:  09/09/2011 FINDINGS: Cardiac shadow is enlarged. Vascular congestion is noted with findings suggestive of a right-sided pleural effusion. No focal confluent infiltrate is seen. No bony abnormality is noted. IMPRESSION: Changes consistent with CHF and right pleural effusion. No focal infiltrate is noted. Electronically Signed   By: Alcide CleverMark  Lukens M.D.   On: 08/27/2016 14:50    EKG:   Orders placed or performed during the hospital encounter of 08/25/16  . EKG 12-Lead    ASSESSMENT AND PLAN:   #  1. right superior pubic ramus fracture,  fracture at the  inferior pubic pubic ramus  and pubic symphysis'. Patient having significant pain in the right hip area.seen by ortho ,conservative management advised.seen by PT .recommended SNF. But due to lack of insurance ,pt cant go to SNF>needs to go home with PT,but has limited support.so pt has no safe d/c plan due to difficulty ambulation and high risk for falls and no support system at home.  Continue IV morphine milligram every 4 hours and oxycodone 5 mg every 4 hours when necessary,  #2 ESRD on hemodialysis Monday, Wednesday, Friday, has hyperkalemia.nephro  On board.  .#3 essential hypertension; BP better controlled.  #4. Diabetes mellitus type 2;;resolved hypoglycemia.  5 .fever;chest xray showed pleural effusion an CH.blood culture negative.stopped Rocephin.  Used  the [place] interpreter.   Discussed with patient, discussed with nurse. Dispo;unknown yet.  All the records are reviewed and case discussed with Care Management/Social Workerr. Management plans discussed with the patient, family and they are in agreement.  CODE STATUS: full  TOTAL TIME TAKING CARE OF THIS PATIENT: 35 minutes.   POSSIBLE D/C IN 2-3 DAYS, DEPENDING ON CLINICAL CONDITION.   Katha Hamming M.D on 08/28/2016 at 6:18 PM  Between 7am to 6pm - Pager - [phone]  After 6pm go to [email or web address] - password [email or web address] [facility]  [place] [facility]  Office  [phone]  CC: Primary care physician; No PCP Per Patient   Note: This dictation was prepared with Dragon dictation along with smaller phrase technology. Any transcriptional errors that result from this process are unintentional.

## 2016-08-28 NOTE — Progress Notes (Signed)
Patient went to dialysis with oxygen and chart. Consents already completed. Patient ate breakfast before leaving floor.

## 2016-08-28 NOTE — Progress Notes (Addendum)
Patient returned from Dialysis via bed. Vital signs stable. C/O nausea, gave PRN zofran. Blood sugar taken and lunch ordered. Called for report after receiving patient.

## 2016-08-28 NOTE — Progress Notes (Signed)
Pre hd 

## 2016-08-28 NOTE — Progress Notes (Signed)
Post hd vitals 

## 2016-08-28 NOTE — Progress Notes (Signed)
  End of hd 

## 2016-08-28 NOTE — Progress Notes (Signed)
Pre hd assessment, language line interpreter utilized

## 2016-08-28 NOTE — Progress Notes (Signed)
Physical Therapy Treatment Patient Details Name: Vanessa Salazar MRN: [id number] DOB: 02-07-1956 Today's Date: 08/28/2016    History of Present Illness 61 y.o. female with a known history of DM, HTN, ESRD on HD, anemia, TIA here after fall at home suffering R pubic ramus fx (minimally displaced).  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    PT Comments    Pt pre-medicated with pain meds for PT session but pt still c/o significant R>L pelvic pain with activity (pt c/o L pelvic pain but was able to bear weight through L LE when standing).  Pt requiring consistent cueing for PWB'ing status during session.  Pt hesitant to perform any functional activity due to high level of pain with activities but eventually was able to stand with RW with min assist x2.  Standing with RW, pt able to slide R foot forward and backwards 1 rep each and slide L foot forward x1 rep; pt attempted to take 1 step backwards with L LE but pt's B UE's and LE's buckled (d/t pelvic pain) requiring 2 assist to maintain upright and return to bed safely.  Will continue to progress pt with strengthening and progressive functional mobility per pt tolerance.  May benefit from therapy BID on non-dialysis days as therapy schedule allows.   Follow Up Recommendations  SNF     Equipment Recommendations  Rolling walker with 5" wheels    Recommendations for Other Services       Precautions / Restrictions Precautions Precautions: Fall Restrictions Weight Bearing Restrictions: Yes RLE Weight Bearing: Partial weight bearing Other Position/Activity Restrictions: must use assistive device    Mobility  Bed Mobility Overal bed mobility: Needs Assistance Bed Mobility: Supine to Sit;Sit to Supine     Supine to sit: Max assist     General bed mobility comments: assist for trunk and B LE's; vc's for technique; use  of bed rail; pt attempted to assist but overall limited d/t pelvic pain  Transfers Overall transfer level: Needs assistance Equipment used: Rolling walker (2 wheeled) Transfers: Sit to/from Stand Sit to Stand: Min assist;+2 physical assistance         General transfer comment: perform 5 trials standing (3 with RW; 2 with L knee blocked and R LE NWB'ing no AD trying to clear bottom from bed) but pt reporting too much pain when attempting and pt resisting/pushing backwards with trunk instead limiting ability to perform transfers; pt eventually stated she wanted to stand without assist and able to initiate stand with RW (primarily WB'ing through L LE) but required min assist x2 to come to full upright posture (with vc's and demo)  Ambulation/Gait Ambulation/Gait assistance: Min assist;Mod assist;+2 physical assistance Ambulation Distance (Feet):  (1 foot forward and backwards) Assistive device: Rolling walker (2 wheeled)       General Gait Details: pt able to slide R foot on ground to advance and bring back towards bed  x1 rep each (unable to clear R foot from floor with cueing); pt able to slide L foot forward on ground and attempted to take a step backwards with L LE but pt's UE's and LE's buckled with this attempt requiring mod assist x2 to maintain upright and return to bed safely   Stairs            Wheelchair Mobility    Modified Rankin (Stroke Patients Only)  Balance Overall balance assessment: Needs assistance Sitting-balance support: Bilateral upper extremity supported;Feet supported Sitting balance-Leahy Scale: Fair     Standing balance support: Bilateral upper extremity supported (on RW) Standing balance-Leahy Scale: Fair Standing balance comment: static standing                    Cognition Arousal/Alertness: Awake/alert Behavior During Therapy:  (Hesitant with activity) Overall Cognitive Status: Within Functional Limits for tasks assessed                       Exercises      General Comments   Nursing cleared pt for participation in physical therapy and pre-medicated pt for PT session.  Pt agreeable to PT session.  Spanish interpreter Vanessa Salazar present for entire PT session.       Pertinent Vitals/Pain Pain Assessment: 0-10 Pain Score: 10-Worst pain ever Pain Location: R >L pelvic pain Pain Descriptors / Indicators: Aching (sharp shooting pain with LE movement/activity) Pain Intervention(s): Limited activity within patient's tolerance;Monitored during session;Premedicated before session;Repositioned  Vitals (HR and O2 on 1 L via nasal cannula) stable and WFL throughout treatment session.    Home Living                      Prior Function            PT Goals (current goals can now be found in the care plan section) Acute Rehab PT Goals Patient Stated Goal: decrease pain PT Goal Formulation: With patient Time For Goal Achievement: 09/10/16 Potential to Achieve Goals: Fair Progress towards PT goals: Progressing toward goals    Frequency    7X/week      PT Plan Current plan remains appropriate    Co-evaluation             End of Session Equipment Utilized During Treatment: Gait belt Activity Tolerance: Patient limited by pain Patient left: in bed;with call bell/phone within reach;with bed alarm set;with family/visitor present (B heels elevated via pillow) Nurse Communication: Mobility status;Precautions (Pt's pain) PT Visit Diagnosis: Muscle weakness (generalized) (M62.81);Difficulty in walking, not elsewhere classified (R26.2);Pain Pain - Right/Left: Right Pain - part of body: Hip     Time: 1500-1540 PT Time Calculation (min) (ACUTE ONLY): 40 min  Charges:  $Therapeutic Activity: 38-52 mins                    G CodesHendricks Limes:       Majed Pellegrin, PT 08/28/16, 4:49 PM [phone]

## 2016-08-28 NOTE — Progress Notes (Signed)
Patient is A&O x4, but only spanish speaking. Only stood at bedside with therapy. Dialysis today. Eating small amounts of food, Nausea x1, gave IV zofran with relief. Gave oral pain meds. Bed alarm on for safety. Blood sugars 182, 130, and 180, S insulin given.

## 2016-08-28 NOTE — Progress Notes (Signed)
Start of hd 

## 2016-08-28 NOTE — Progress Notes (Signed)
PT Cancellation Note  Patient Details Name: Vanessa Salazar MRN: [id number] DOB: [birth year]   Cancelled Treatment:    Reason Eval/Treat Not Completed: Patient at procedure or test/unavailable.  Nursing reports pt currently off floor at dialysis.  Will re-attempt PT treatment at a later date/time.  Hendricks LimesEmily Evanee Lubrano, PT 08/28/16, 9:57 AM [phone]

## 2016-08-28 NOTE — Progress Notes (Signed)
Post hd assessment 

## 2016-08-29 LAB — GLUCOSE, CAPILLARY
GLUCOSE-CAPILLARY: 124 mg/dL — AB (ref 65–99)
Glucose-Capillary: 144 mg/dL — ABNORMAL HIGH (ref 65–99)
Glucose-Capillary: 166 mg/dL — ABNORMAL HIGH (ref 65–99)
Glucose-Capillary: 259 mg/dL — ABNORMAL HIGH (ref 65–99)

## 2016-08-29 MED ORDER — POLYETHYLENE GLYCOL 3350 17 G PO PACK
17.0000 g | PACK | Freq: Every day | ORAL | Status: DC
  Administered 2016-08-29 – 2016-09-01 (×3): 17 g via ORAL
  Filled 2016-08-29 (×3): qty 1

## 2016-08-29 MED ORDER — EPOETIN ALFA 4000 UNIT/ML IJ SOLN
4000.0000 [IU] | INTRAMUSCULAR | Status: DC
  Administered 2016-08-30 – 2016-09-02 (×2): 4000 [IU] via INTRAVENOUS
  Filled 2016-08-29 (×2): qty 1

## 2016-08-29 MED ORDER — IBUPROFEN 400 MG PO TABS
400.0000 mg | ORAL_TABLET | Freq: Three times a day (TID) | ORAL | Status: DC
  Administered 2016-08-29 – 2016-09-02 (×12): 400 mg via ORAL
  Filled 2016-08-29 (×12): qty 1

## 2016-08-29 MED ORDER — METHOCARBAMOL 500 MG PO TABS
500.0000 mg | ORAL_TABLET | Freq: Three times a day (TID) | ORAL | Status: DC
  Administered 2016-08-29 – 2016-09-01 (×11): 500 mg via ORAL
  Filled 2016-08-29 (×11): qty 1

## 2016-08-29 NOTE — Progress Notes (Addendum)
Physical Therapy Treatment Patient Details Name: Vanessa ShellingMaria D Medina Lara MRN: [id number] DOB: Dec 19, 1955 Today's Date: 08/29/2016    History of Present Illness 61 y.o. female with a known history of DM, HTN, ESRD on HD, anemia, TIA here after fall at home suffering R pubic ramus fx (minimally displaced).  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    PT Comments    Gradual progression in gait distance with noted improvement in R LE advancement, L LE step height/length.  Remains slow and guarded, but more confident without buckling or LOB. Will plan to trial stair negotiation next date to ensure ability to safety enter/exit home environment.  Spanish interpreter, Wilford Cornertto Afanador, present to assist with communication throughout session.    Follow Up Recommendations  SNF     Equipment Recommendations  Rolling walker with 5" wheels    Recommendations for Other Services       Precautions / Restrictions Precautions Precautions: Fall Precaution Comments: No BP L UE Restrictions Weight Bearing Restrictions: Yes RLE Weight Bearing: Partial weight bearing    Mobility  Bed Mobility Overal bed mobility: Needs Assistance Bed Mobility: Sit to Supine     Supine to sit: Mod assist;Max assist     General bed mobility comments: unable to actively elevate LEs without max assist from therapist  Transfers Overall transfer level: Needs assistance Equipment used: Rolling walker (2 wheeled) Transfers: Sit to/from Stand Sit to Stand: Min assist         General transfer comment: pulls on RW for assist with lift off; very slow and deliberate  Ambulation/Gait Ambulation/Gait assistance: Min assist Ambulation Distance (Feet): 12 Feet Assistive device: Rolling walker (2 wheeled)       General Gait Details: step to gait pattern; improved R LE advancement and L LE step  height/length this PM.  Remains very guarded throughout gait sequence.   Stairs            Wheelchair Mobility    Modified Rankin (Stroke Patients Only)       Balance Overall balance assessment: Needs assistance Sitting-balance support: No upper extremity supported;Feet supported Sitting balance-Leahy Scale: Good     Standing balance support: Bilateral upper extremity supported Standing balance-Leahy Scale: Fair Standing balance comment: static standing                    Cognition Arousal/Alertness: Awake/alert Behavior During Therapy: WFL for tasks assessed/performed Overall Cognitive Status: Within Functional Limits for tasks assessed                      Exercises Other Exercises Other Exercises: Sit/stand with RW, min assist +1 x5 reps; cuing for hand placement, continuous movement transition and postural extension once upright.  Progressed to static stance with push/pull of RW to increase WBing bilat LEs, min assist for standing balance Other Exercises: Bed/chair transfer, SPT with RW, min assist with increased time.  Step by step cuing for walker management and movement sequencing.  Fair balance and weight acceptance in R LE without buckling noted.    General Comments        Pertinent Vitals/Pain Pain Assessment: 0-10 Pain Score: 3  Pain Location: 3/10 at rest, 10/10 with WBing and activity Pain Descriptors / Indicators: Aching;Grimacing;Guarding Pain Intervention(s): Limited activity within patient's tolerance;Monitored during session;Repositioned;Premedicated before session    Home Living  Prior Function            PT Goals (current goals can now be found in the care plan section) Acute Rehab PT Goals Patient Stated Goal: decrease pain PT Goal Formulation: With patient Time For Goal Achievement: 09/10/16 Potential to Achieve Goals: Fair Progress towards PT goals: Progressing toward goals     Frequency    7X/week      PT Plan Current plan remains appropriate    Co-evaluation             End of Session Equipment Utilized During Treatment: Gait belt Activity Tolerance: Patient tolerated treatment well;Patient limited by pain Patient left: with call bell/phone within reach;in bed;with bed alarm set;with family/visitor present Nurse Communication: Mobility status PT Visit Diagnosis: Muscle weakness (generalized) (M62.81);Difficulty in walking, not elsewhere classified (R26.2);Pain Pain - Right/Left: Right Pain - part of body: Hip     Time: 1610-9604 PT Time Calculation (min) (ACUTE ONLY): 18 min  Charges:  $Gait Training: 8-22 mins $Therapeutic Activity: 8-22 mins                    G Codes:       Janeece Blok H. Manson Passey, PT, DPT, NCS 08/29/16, 2:15 PM [phone]

## 2016-08-29 NOTE — Progress Notes (Addendum)
Physical Therapy Treatment Patient Details Name: Vanessa Salazar MRN: [id number] DOB: 03-01-1956 Today's Date: 08/29/2016    History of Present Illness 61 y.o. female with a known history of DM, HTN, ESRD on HD, anemia, TIA here after fall at home suffering R pubic ramus fx (minimally displaced).  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    PT Comments    Patient continues to rate pain at 9-10/10 with any activity, but aware of importance of mobility progression and agreeable to session.  Improved active effort with all functional activities, but requires increased time and physical assist for all transitional movement and gait. Was able to initiate OOB to chair and short-distance gait with RW this date; very slow, guarded and deliberate. However, able to complete without buckling; does require extensive cuing throughout due to pain, fear of movement. Unable to demonstrate ability to complete household distance gait; may benefit from use of manual WC upon discharge.  Tolerating session on RA (removed per clearance from RN), sats maintained >94% on RA throughout.  Spanish interpreter, Robert Bellow, present throughout session to assist.   Follow Up Recommendations  SNF     Equipment Recommendations  Rolling walker with 5" wheels    Recommendations for Other Services       Precautions / Restrictions Precautions Precautions: Fall Precaution Comments: No BP L UE Restrictions Weight Bearing Restrictions: Yes RLE Weight Bearing: Partial weight bearing    Mobility  Bed Mobility Overal bed mobility: Needs Assistance Bed Mobility: Supine to Sit     Supine to sit: Min assist;Mod assist     General bed mobility comments: assist for LE management and scooting towards edge of bed  Transfers Overall transfer level: Needs assistance Equipment used: Rolling  walker (2 wheeled) Transfers: Sit to/from Stand Sit to Stand: Min assist         General transfer comment: tends to pull on RW for all movement transitions; heavy use of bilat UEs to assist with movement transition.  Tends to hesitate mid-way through sit/stand; encouraged for continuous movement to decrease pain/stress during transition  Ambulation/Gait Ambulation/Gait assistance: Min assist;Mod assist Ambulation Distance (Feet): 8 Feet Assistive device: Rolling walker (2 wheeled)       General Gait Details: step to gait pattern with heavy WBing bilat UEs; step by step cuing for technique.  Tends to slide R LE (vs step) during advancement; able to step with L LE majority of gait trial.  No buckling noted this date.  Extensive encouragement required due to pain; unable to tolerate additional distance due to pain.   Stairs            Wheelchair Mobility    Modified Rankin (Stroke Patients Only)       Balance Overall balance assessment: Needs assistance Sitting-balance support: No upper extremity supported;Feet supported Sitting balance-Leahy Scale: Good     Standing balance support: Bilateral upper extremity supported Standing balance-Leahy Scale: Fair Standing balance comment: static standing                    Cognition Arousal/Alertness: Awake/alert Behavior During Therapy: WFL for tasks assessed/performed Overall Cognitive Status: Within Functional Limits for tasks assessed                      Exercises Other Exercises Other Exercises: Sit/stand with RW, min assist +1 x5 reps; cuing for  hand placement, continuous movement transition and postural extension once upright.  Progressed to static stance with push/pull of RW to increase WBing bilat LEs, min assist for standing balance Other Exercises: Bed/chair transfer, SPT with RW, min assist with increased time.  Step by step cuing for walker management and movement sequencing.  Fair balance and  weight acceptance in R LE without buckling noted.    General Comments        Pertinent Vitals/Pain Pain Assessment: 0-10 Pain Score: 10-Worst pain ever Pain Descriptors / Indicators: Aching;Grimacing;Guarding Pain Intervention(s): Limited activity within patient's tolerance;Monitored during session;Premedicated before session;Repositioned    Home Living                      Prior Function            PT Goals (current goals can now be found in the care plan section) Acute Rehab PT Goals Patient Stated Goal: decrease pain PT Goal Formulation: With patient Time For Goal Achievement: 09/10/16 Potential to Achieve Goals: Fair    Frequency    7X/week      PT Plan      Co-evaluation             End of Session Equipment Utilized During Treatment: Gait belt Activity Tolerance: Patient tolerated treatment well;Patient limited by pain Patient left: in chair;with call bell/phone within reach;with chair alarm set Nurse Communication: Mobility status PT Visit Diagnosis: Muscle weakness (generalized) (M62.81);Difficulty in walking, not elsewhere classified (R26.2);Pain Pain - Right/Left: Right Pain - part of body: Hip     Time: [id number] PT Time Calculation (min) (ACUTE ONLY): 30 min  Charges:  $Gait Training: 8-22 mins $Therapeutic Activity: 8-22 mins                    G Codes:       Quashawn Jewkes H. Manson PasseyBrown, PT, DPT, NCS 08/29/16, 11:43 AM [phone]

## 2016-08-29 NOTE — Progress Notes (Addendum)
[facility] [place] [facility]   PATIENT NAME: Vanessa KindredMaria Medina Salazar    MR#:  [id number]  DATE OF BIRTH:  1956/03/23  SUBJECTIVE: Admitted for fall, pelvic fracture.still having lot of pain ,  . Not  motivated to work with physical therapy because of pain. Patient encouraged to work with physical therapy today. D/w Engineer, structuralspanish translator. Told her that she has to go home because of lack of insurance unable to place her. Patient cannot get any home services also because of lack of insurance. I spoke with her about this.  CHIEF COMPLAINT:   Chief Complaint  Patient presents with  . Fall  . Hip Pain    REVIEW OF SYSTEMS:   ROS CONSTITUTIONAL:  No fever, EYES: No blurred or double vision.  EARS, NOSE, AND THROAT: No tinnitus or ear pain.  RESPIRATORY: No cough, shortness of breath, wheezing or hemoptysis.  CARDIOVASCULAR: No chest pain, orthopnea, edema.  GASTROINTESTINAL: has  nausea,  No vomiting, diarrhea or abdominal pain.  GENITOURINARY:Hd patient does not make any urine. ENDOCRINE: No polyuria, nocturia,  HEMATOLOGY: No anemia, easy bruising or bleeding SKIN: No rash or lesion. MUSCULOSKELETAL: right pelvic pain, difficulty with ambulation. NEUROLOGIC: No tingling, numbness, weakness.  PSYCHIATRY: No anxiety or depression.   DRUG ALLERGIES:  No Known Allergies  VITALS:  Blood pressure (!) 145/61, pulse 80, temperature 99.8 F (37.7 C), temperature source Oral, resp. rate 16, height 5\' 2"  (1.575 m), weight 58.9 kg (129 lb 13.6 oz), last menstrual period 06/24/2005, SpO2 100 %.  PHYSICAL EXAMINATION:  GENERAL:  61 y.o.-year-old patient lying in the bed with no acute distress. Overall ill-appearing female. Moaning in pain. EYES: Pupils equal, round, reactive to light and accommodation. No scleral icterus. Extraocular muscles intact.  HEENT: Head atraumatic, normocephalic. Oropharynx and nasopharynx clear.  NECK:  Supple, no jugular venous distention.  No thyroid enlargement, no tenderness.  LUNGS: Normal breath sounds bilaterally, no wheezing, rales,rhonchi or crepitation. No use of accessory muscles of respiration.  CARDIOVASCULAR: S1, S2 normal. No murmurs, rubs, or gallops.  ABDOMEN: Soft, nontender, nondistended. Bowel sounds present. No organomegaly or mass.  EXTREMITIES: Patient has tenderness to palpation over the right hip area, decreased range of motion secondary to pain in the right hip. NEUROLOGIC: Cranial nerves II through XII are intact. Muscle strength 5/5 in all extremities. Sensation intact. Gait not checked.  PSYCHIATRIC: The patient is alert and oriented x 3.  SKIN: No obvious rash, lesion, or ulcer.    LABORATORY PANEL:   CBC  Recent Labs Lab 08/28/16 0403  WBC 4.6  HGB 7.5*  HCT 22.5*  PLT 83*   ------------------------------------------------------------------------------------------------------------------  Chemistries   Recent Labs Lab 08/26/16 0406 08/28/16 0403  NA 137 135  K 5.8* 5.2*  CL 104 93*  CO2 22 28  GLUCOSE 188* 158*  BUN 62* 45*  CREATININE 9.22* 6.62*  CALCIUM 8.5* 8.3*  AST 20  --   ALT 11*  --   ALKPHOS 115  --   BILITOT 0.6  --    ------------------------------------------------------------------------------------------------------------------  Cardiac Enzymes No results for input(s): TROPONINI in the last 168 hours. ------------------------------------------------------------------------------------------------------------------  RADIOLOGY:  Dg Chest 1 View  Result Date: 08/27/2016 CLINICAL DATA:  Fevers EXAM: CHEST 1 VIEW COMPARISON:  09/09/2011 FINDINGS: Cardiac shadow is enlarged. Vascular congestion is noted with findings suggestive of a right-sided pleural effusion. No focal confluent infiltrate is seen. No bony abnormality is noted. IMPRESSION: Changes consistent with CHF and right pleural effusion. No focal  infiltrate is noted. Electronically Signed   By: Alcide Clever M.D.   On: 08/27/2016 14:50    EKG:   Orders placed or performed during the hospital encounter of 08/25/16  . EKG 12-Lead    ASSESSMENT AND PLAN:   #1. right superior pubic ramus fracture,  fracture at the inferior pubic pubic ramus  and pubic symphysis'. Patient having significant pain in the right hip area.seen by ortho ,conservative management advised.seen by PT .recommended SNF. But due to lack of insurance ,pt cant go to SNF>needs to go home but has limited support.so pt has no safe d/c plan due to difficulty ambulation and high risk for falls and no support system at home. I spoke with patient's son Mr.  Felipa Furnace today and explained that she needs support at home. She will be discharged home tomorrow after hemodialysis. Change to po -pain medicines for  Transition.   #2 ESRD on hemodialysis Monday, Wednesday, Friday, has hyperkalemia.nephro  On board.  .#3 essential hypertension; BP better controlled.  #4. Diabetes mellitus type 2;;resolved hypoglycemia.continue SSI with coverage.  5 .fever;resolved.  Used  the [place] interpreter.  Hypoxia due to pain;explained how to use this spirometry with help of spanish translator.  Discussed with patient, discussed with nurse. Dispo;' home tomorrow.  All the records are reviewed and case discussed with Care Management/Social Workerr. Management plans discussed with the patient, family and they are in agreement.  CODE STATUS: full  TOTAL TIME TAKING CARE OF THIS PATIENT: 35 minutes.   POSSIBLE D/C IN 2-3 DAYS, DEPENDING ON CLINICAL CONDITION.   Katha Hamming M.D on 08/29/2016 at 9:55 AM  Between 7am to 6pm - Pager - [phone]  After 6pm go to [email or web address] - password [email or web address] [facility]  [place] [facility]  Office  [phone]  CC: Primary care physician; No PCP Per Patient   Note: This dictation was prepared with Dragon dictation along with smaller phrase technology. Any transcriptional errors that result  from this process are unintentional.

## 2016-08-29 NOTE — Progress Notes (Signed)
Subjective:   Conversation through [employer] interpreter Doing fair today  Appetite is poor No SOB No N/V    Objective:  Vital signs in last 24 hours:  Temp:  [97.7 F (36.5 C)-99.8 F (37.7 C)] 97.7 F (36.5 C) (03/08 1606) Pulse Rate:  [69-80] 69 (03/08 1606) Resp:  [16-19] 16 (03/08 0830) BP: (134-146)/(48-61) 134/55 (03/08 1606) SpO2:  [80 %-100 %] 96 % (03/08 1606)  Weight change:  Filed Weights   08/26/16 1355 08/28/16 0950 08/28/16 1327  Weight: 64.9 kg (143 lb) 59.8 kg (131 lb 13.4 oz) 58.9 kg (129 lb 13.6 oz)    Intake/Output:    Intake/Output Summary (Last 24 hours) at 08/29/16 1700 Last data filed at 08/29/16 1003  Gross per 24 hour  Intake              240 ml  Output                0 ml  Net              240 ml     Physical Exam: General: Sitting up in chair, NAD  HEENT anicteric  Neck supple  Pulm/lungs Coarse at bases, no crackles  CVS/Heart No rub or gallop  Abdomen:  Soft, NT  Extremities: Trace edema  Neurologic: Alert, able to follow commands  Skin: Warm, dry  Access: AVF        Basic Metabolic Panel:   Recent Labs Lab 08/25/16 2204 08/26/16 0406 08/28/16 0403  NA 137 137 135  K 5.8* 5.8* 5.2*  CL 103 104 93*  CO2 20* 22 28  GLUCOSE 270* 188* 158*  BUN 57* 62* 45*  CREATININE 8.75* 9.22* 6.62*  CALCIUM 9.1 8.5* 8.3*     CBC:  Recent Labs Lab 08/25/16 2204 08/26/16 0406 08/28/16 0403  WBC 5.2 5.4 4.6  NEUTROABS 4.4  --   --   HGB 9.0* 7.9* 7.5*  HCT 27.5* 23.3* 22.5*  MCV 99.6 99.7 98.5  PLT 136* 112* 83*      Microbiology:  Recent Results (from the past 720 hour(s))  CULTURE, BLOOD (ROUTINE X 2) w Reflex to ID Panel     Status: None (Preliminary result)   Collection Time: 08/27/16  2:05 PM  Result Value Ref Range Status   Specimen Description BLOOD RIGHT FA  Final   Special Requests BOTTLES DRAWN AEROBIC AND ANAEROBIC BCAV  Final   Culture NO GROWTH 2 DAYS  Final   Report Status PENDING  Incomplete   CULTURE, BLOOD (ROUTINE X 2) w Reflex to ID Panel     Status: None (Preliminary result)   Collection Time: 08/27/16  2:05 PM  Result Value Ref Range Status   Specimen Description BLOOD RIGHT HAND  Final   Special Requests BOTTLES DRAWN AEROBIC AND ANAEROBIC BCAV  Final   Culture NO GROWTH 2 DAYS  Final   Report Status PENDING  Incomplete    Coagulation Studies: No results for input(s): LABPROT, INR in the last 72 hours.  Urinalysis: No results for input(s): COLORURINE, LABSPEC, PHURINE, GLUCOSEU, HGBUR, BILIRUBINUR, KETONESUR, PROTEINUR, UROBILINOGEN, NITRITE, LEUKOCYTESUR in the last 72 hours.  Invalid input(s): APPERANCEUR    Imaging: No results found.   Medications:    . amLODipine  10 mg Oral Daily  . atorvastatin  20 mg Oral QPM  . cinacalcet  30 mg Oral Q breakfast  . docusate sodium  100 mg Oral BID  . feeding supplement (NEPRO CARB STEADY)  237 mL  Oral BID BM  . furosemide  80 mg Oral Daily  . heparin  5,000 Units Subcutaneous Q8H  . hydrALAZINE  100 mg Oral BID  . ibuprofen  400 mg Oral TID  . insulin aspart  0-5 Units Subcutaneous QHS  . insulin aspart  0-9 Units Subcutaneous TID WC  . methocarbamol  500 mg Oral TID  . pantoprazole  40 mg Oral Daily  . polyethylene glycol  17 g Oral Daily  . sevelamer carbonate  1,600 mg Oral TID WC & HS   acetaminophen **OR** acetaminophen, albuterol, bisacodyl, ipratropium, magnesium citrate, ondansetron **OR** ondansetron (ZOFRAN) IV, oxyCODONE, senna-docusate  Assessment/ Plan:  61 y.o. female with ESRD, diabetes, hypertension, anemia, TIA, who was admitted to [facility] on 08/25/2016 for evaluation of right hip pain after a fall.   [address], MWF, 3 hrs, [facility] Nephrology  1.  End-stage renal disease We will arrange for dialysis for the patient while she is inpatient Next HD tomorrow  2.  Hypertension Blood pressure is better controlled  3.  Anemia of chronic kidney disease Hemoglobin 7.5 We will obtain iron  studies EPO with HD  4.  Hyperkalemia Expected to correct with dialysis Low K diet   LOS: 4 Vanessa Salazar 3/8/20185:00 PM

## 2016-08-29 NOTE — Care Management (Signed)
Dialysis center to loan patient a wheelchair for home use

## 2016-08-30 LAB — IRON AND TIBC
IRON: 149 ug/dL (ref 28–170)
SATURATION RATIOS: 77 % — AB (ref 10.4–31.8)
TIBC: 193 ug/dL — ABNORMAL LOW (ref 250–450)
UIBC: 44 ug/dL

## 2016-08-30 LAB — GLUCOSE, CAPILLARY
GLUCOSE-CAPILLARY: 153 mg/dL — AB (ref 65–99)
GLUCOSE-CAPILLARY: 167 mg/dL — AB (ref 65–99)
Glucose-Capillary: 224 mg/dL — ABNORMAL HIGH (ref 65–99)

## 2016-08-30 LAB — TRANSFERRIN: Transferrin: 140 mg/dL — ABNORMAL LOW (ref 192–382)

## 2016-08-30 LAB — FERRITIN: FERRITIN: 1333 ng/mL — AB (ref 11–307)

## 2016-08-30 MED ORDER — LOSARTAN POTASSIUM 50 MG PO TABS
100.0000 mg | ORAL_TABLET | Freq: Every day | ORAL | Status: DC
  Administered 2016-08-30 – 2016-09-01 (×3): 100 mg via ORAL
  Filled 2016-08-30 (×3): qty 2

## 2016-08-30 MED ORDER — HYDROCODONE-ACETAMINOPHEN 7.5-325 MG PO TABS
1.0000 | ORAL_TABLET | Freq: Four times a day (QID) | ORAL | Status: DC
  Administered 2016-08-30 – 2016-08-31 (×3): 1 via ORAL
  Filled 2016-08-30 (×3): qty 1

## 2016-08-30 MED ORDER — AMLODIPINE BESYLATE 10 MG PO TABS
10.0000 mg | ORAL_TABLET | Freq: Every day | ORAL | Status: DC
  Administered 2016-08-31 – 2016-09-01 (×2): 10 mg via ORAL
  Filled 2016-08-30 (×2): qty 1

## 2016-08-30 MED ORDER — LIDOCAINE 5 % EX PTCH
1.0000 | MEDICATED_PATCH | Freq: Every day | CUTANEOUS | Status: DC
  Administered 2016-08-31 – 2016-09-01 (×2): 1 via TRANSDERMAL
  Filled 2016-08-30 (×4): qty 1

## 2016-08-30 NOTE — Progress Notes (Signed)
PRE DIALYSIS ASSESSMENT 

## 2016-08-30 NOTE — Progress Notes (Signed)
Subjective:   Patient seen during dialysis Tolerating well    HEMODIALYSIS FLOWSHEET:  Blood Flow Rate (mL/min): (P) 400 mL/min Arterial Pressure (mmHg): (P) -140 mmHg Venous Pressure (mmHg): (P) 160 mmHg Transmembrane Pressure (mmHg): (P) 60 mmHg Ultrafiltration Rate (mL/min): (P) 430 mL/min Dialysate Flow Rate (mL/min): (P) 800 ml/min Conductivity: Machine : (P) 14 Conductivity: Machine : (P) 14 Dialysis Fluid Bolus: (P) Normal Saline Bolus Amount (mL): (P) 250 mL Dialysate Change: 2K Intra-Hemodialysis Comments: (P) ...tx completed.      Objective:  Vital signs in last 24 hours:  Temp:  [97.7 F (36.5 C)-98.5 F (36.9 C)] 98.5 F (36.9 C) (03/09 1102) Pulse Rate:  [66-96] 96 (03/09 1430) Resp:  [11-18] 12 (03/09 1430) BP: (125-161)/(48-81) (P) 172/60 (03/09 1430) SpO2:  [90 %-96 %] 92 % (03/09 1055)  Weight change:  Filed Weights   08/26/16 1355 08/28/16 0950 08/28/16 1327  Weight: 64.9 kg (143 lb) 59.8 kg (131 lb 13.4 oz) 58.9 kg (129 lb 13.6 oz)    Intake/Output:    Intake/Output Summary (Last 24 hours) at 08/30/16 1444 Last data filed at 08/30/16 1003  Gross per 24 hour  Intake              240 ml  Output                0 ml  Net              240 ml     Physical Exam: General: NAD  HEENT anicteric  Neck supple  Pulm/lungs Coarse at bases, no crackles  CVS/Heart No rub or gallop  Abdomen:  Soft, NT  Extremities: Trace edema  Neurologic: Resting quietly  Skin: Warm, dry  Access: AVF        Basic Metabolic Panel:   Recent Labs Lab 08/25/16 2204 08/26/16 0406 08/28/16 0403  NA 137 137 135  K 5.8* 5.8* 5.2*  CL 103 104 93*  CO2 20* 22 28  GLUCOSE 270* 188* 158*  BUN 57* 62* 45*  CREATININE 8.75* 9.22* 6.62*  CALCIUM 9.1 8.5* 8.3*     CBC:  Recent Labs Lab 08/25/16 2204 08/26/16 0406 08/28/16 0403  WBC 5.2 5.4 4.6  NEUTROABS 4.4  --   --   HGB 9.0* 7.9* 7.5*  HCT 27.5* 23.3* 22.5*  MCV 99.6 99.7 98.5  PLT 136*  112* 83*      Microbiology:  Recent Results (from the past 720 hour(s))  CULTURE, BLOOD (ROUTINE X 2) w Reflex to ID Panel     Status: None (Preliminary result)   Collection Time: 08/27/16  2:05 PM  Result Value Ref Range Status   Specimen Description BLOOD RIGHT FA  Final   Special Requests BOTTLES DRAWN AEROBIC AND ANAEROBIC BCAV  Final   Culture NO GROWTH 3 DAYS  Final   Report Status PENDING  Incomplete  CULTURE, BLOOD (ROUTINE X 2) w Reflex to ID Panel     Status: None (Preliminary result)   Collection Time: 08/27/16  2:05 PM  Result Value Ref Range Status   Specimen Description BLOOD RIGHT HAND  Final   Special Requests BOTTLES DRAWN AEROBIC AND ANAEROBIC BCAV  Final   Culture NO GROWTH 3 DAYS  Final   Report Status PENDING  Incomplete    Coagulation Studies: No results for input(s): LABPROT, INR in the last 72 hours.  Urinalysis: No results for input(s): COLORURINE, LABSPEC, PHURINE, GLUCOSEU, HGBUR, BILIRUBINUR, KETONESUR, PROTEINUR, UROBILINOGEN, NITRITE, LEUKOCYTESUR in the last 72  hours.  Invalid input(s): APPERANCEUR    Imaging: No results found.   Medications:    . amLODipine  10 mg Oral Daily  . atorvastatin  20 mg Oral QPM  . cinacalcet  30 mg Oral Q breakfast  . docusate sodium  100 mg Oral BID  . epoetin (EPOGEN/PROCRIT) injection  4,000 Units Intravenous Q M,W,F-HD  . feeding supplement (NEPRO CARB STEADY)  237 mL Oral BID BM  . furosemide  80 mg Oral Daily  . heparin  5,000 Units Subcutaneous Q8H  . hydrALAZINE  100 mg Oral BID  . HYDROcodone-acetaminophen  1 tablet Oral QID  . ibuprofen  400 mg Oral TID  . insulin aspart  0-5 Units Subcutaneous QHS  . insulin aspart  0-9 Units Subcutaneous TID WC  . lidocaine  1 patch Transdermal Daily  . methocarbamol  500 mg Oral TID  . polyethylene glycol  17 g Oral Daily  . sevelamer carbonate  1,600 mg Oral TID WC & HS   acetaminophen **OR** acetaminophen, albuterol, bisacodyl, ipratropium, magnesium  citrate, ondansetron **OR** ondansetron (ZOFRAN) IV, oxyCODONE, senna-docusate  Assessment/ Plan:  61 y.o. female with ESRD, diabetes, hypertension, anemia, TIA, who was [facility] to [facility] on 08/25/2016 for evaluation of right hip pain after a fall.   [address], MWF, 3 hrs, [facility]  1.  End-stage renal disease HD MWF  2.  Hypertension Blood pressure is better controlled Amlodipine at night Added losartan at night  3.  Anemia of chronic kidney disease Hemoglobin 7.5 t sat 77%, ferritin 1333 EPO with HD  4.  Hyperkalemia Low K diet     LOS: 5 Corene Resnick 3/9/20182:44 PM

## 2016-08-30 NOTE — Progress Notes (Signed)
Post hd tx 

## 2016-08-30 NOTE — Progress Notes (Signed)
HD STARTED  

## 2016-08-30 NOTE — Progress Notes (Signed)
[facility] [place] at [facility]   PATIENT NAME: Vanessa KindredMaria Medina Salazar    MR#:  [id number]  DATE OF BIRTH:  08-27-1955  SUBJECTIVE: Admitted for fall, pelvic fracture.still having lot of pain ,  . Not  motivated to work with physical therapy because of pain. Pt still having lof  Of pelvic pain 10/10 severity.  CHIEF COMPLAINT:   Chief Complaint  Patient presents with  . Fall  . Hip Pain    REVIEW OF SYSTEMS:   ROS CONSTITUTIONAL:  No fever, EYES: No blurred or double vision.  EARS, NOSE, AND THROAT: No tinnitus or ear pain.  RESPIRATORY: No cough, shortness of breath, wheezing or hemoptysis.  CARDIOVASCULAR: No chest pain, orthopnea, edema.  GASTROINTESTINAL: has  nausea,  No vomiting, diarrhea or abdominal pain.  GENITOURINARY:Hd patient does not make any urine. ENDOCRINE: No polyuria, nocturia,  HEMATOLOGY: No anemia, easy bruising or bleeding SKIN: No rash or lesion. MUSCULOSKELETAL: right pelvic pain, difficulty with ambulation. NEUROLOGIC: No tingling, numbness, weakness.  PSYCHIATRY: No anxiety or depression.   DRUG ALLERGIES:  No Known Allergies  VITALS:  Blood pressure (!) 154/81, pulse 78, temperature 98.5 F (36.9 C), temperature source Oral, resp. rate 14, height 5\' 2"  (1.575 m), weight 58.9 kg (129 lb 13.6 oz), last menstrual period 06/24/2005, SpO2 92 %.  PHYSICAL EXAMINATION:  GENERAL:  61 y.o.-year-old patient lying in the bed with no acute distress. Overall ill-appearing female. Moaning in pain. EYES: Pupils equal, round, reactive to light and accommodation. No scleral icterus. Extraocular muscles intact.  HEENT: Head atraumatic, normocephalic. Oropharynx and nasopharynx clear.  NECK:  Supple, no jugular venous distention. No thyroid enlargement, no tenderness.  LUNGS: Normal breath sounds bilaterally, no wheezing, rales,rhonchi or crepitation. No use of accessory muscles of respiration.  CARDIOVASCULAR: S1, S2 normal. No murmurs,  rubs, or gallops.  ABDOMEN: Soft, nontender, nondistended. Bowel sounds present. No organomegaly or mass.  EXTREMITIES: Patient has tenderness to palpation over the right hip area, decreased range of motion secondary to pain in the right hip. NEUROLOGIC: Cranial nerves II through XII are intact. Muscle strength 5/5 in all extremities. Sensation intact. Gait not checked.  PSYCHIATRIC: The patient is alert and oriented x 3.  SKIN: No obvious rash, lesion, or ulcer.    LABORATORY PANEL:   CBC  Recent Labs Lab 08/28/16 0403  WBC 4.6  HGB 7.5*  HCT 22.5*  PLT 83*   ------------------------------------------------------------------------------------------------------------------  Chemistries   Recent Labs Lab 08/26/16 0406 08/28/16 0403  NA 137 135  K 5.8* 5.2*  CL 104 93*  CO2 22 28  GLUCOSE 188* 158*  BUN 62* 45*  CREATININE 9.22* 6.62*  CALCIUM 8.5* 8.3*  AST 20  --   ALT 11*  --   ALKPHOS 115  --   BILITOT 0.6  --    ------------------------------------------------------------------------------------------------------------------  Cardiac Enzymes No results for input(s): TROPONINI in the last 168 hours. ------------------------------------------------------------------------------------------------------------------  RADIOLOGY:  No results found.  EKG:   Orders placed or performed during the hospital encounter of 08/25/16  . EKG 12-Lead    ASSESSMENT AND PLAN:   #1. right superior pubic ramus fracture,  fracture at the inferior pubic pubic ramus  and pubic symphysis'. Patient having significant pain in the right hip area.seen by ortho ,conservative management advised.seen by PT .recommended SNF. But due to lack of insurance ,pt cant go to SNF>needs to go home but has limited support.so pt has no safe d/c plan due to difficulty ambulation and  high risk for falls and no support system at home. I spoke with patient's son Mr.  Felipa Furnace today and explained that she  needs support at home. So significant pain and unable to ambulate I am increasing the pain medicine dose and see how she does I explained to her with the help of spanishtranslator   that she needs to go home either Saturday or Sunday.  #2 ESRD on hemodialysis Monday, Wednesday, Friday, has hyperkalemia.nephro  On board.  .#3 essential hypertension; BP better controlled.  #4. Diabetes mellitus type 2;;resolved hypoglycemia.continue SSI with coverage.  5 .fever;resolved.  Used  the [place] interpreter.  Hypoxia due to pain;explained how to use this spirometry with help of spanish translator.  Discussed with patient, discussed with nurse. Dispo;' home tomorrow.  All the records are reviewed and case discussed with Care Management/Social Workerr. Management plans discussed with the patient, family and they are in agreement.  CODE STATUS: full  TOTAL TIME TAKING CARE OF THIS PATIENT: 35 minutes.   POSSIBLE D/C IN 2-3 DAYS, DEPENDING ON CLINICAL CONDITION.   Katha Hamming M.D on 08/30/2016 at 1:30 PM  Between 7am to 6pm - Pager - [phone]  After 6pm go to [email or web address] - password [email or web address] [facility]  [facility]  Office  [phone]  CC: Primary care physician; No PCP Per Patient   Note: This dictation was prepared with Dragon dictation along with smaller phrase technology. Any transcriptional errors that result from this process are unintentional.

## 2016-08-30 NOTE — Progress Notes (Signed)
Post hd tx: Tolerated 3.5hours of treatment with 1liter net removal.Interpretor present during tx for questions.Hemastasis achieved,sites dressed with gauze/taped.

## 2016-08-30 NOTE — Progress Notes (Signed)
Pre-hd tx 

## 2016-08-30 NOTE — Progress Notes (Signed)
Took over care at 1500. Returned from Dialysis. Gave oral pain meds, with good results. Bed alarm on for safety.

## 2016-08-30 NOTE — Progress Notes (Addendum)
Physical Therapy Treatment Patient Details Name: Vanessa Salazar MRN: [id number] DOB: 1956/05/17 Today's Date: 08/30/2016    History of Present Illness 61 y.o. female with a known history of DM, HTN, ESRD on HD, anemia, TIA here after fall at home suffering R pubic ramus fx (minimally displaced).  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    PT Comments    Continued progression in gait distance, though remains very slow and guarded; distance still limited by pain in R LE with WBing efforts.  Able to complete supine/sit without physical assist this date; however, requires elevated HOB and bedrails to complete (refused to allow therapist to place bed flat or remove bedrails to simulate home environment). Attempted stair training-unable/unwilling to attempt due to pain.  Max cuing/encouragement provided, but unable to redirect despite. Patient very focused on whey she is "going home before I can walk".  Did review that discharge from home is based on medical stability and reviewed importance of full effort/participation with therapy while in hospital to maximize performance upon discharge.  Patient voiced understanding, but question full comprehension. Patient with limited active involvement/effort in discharge planning process at this point.   Did express concern with social work about ability to safety enter/exit home or travel to/from dialysis (typically uses [facility]; unsure if patient will be able to fully access transportation).  Would ultimately benefit from use of manual WC immediately upon discharge vs EMS transport home.  Social work to communicate with Futures trader as appropriate.  Spanish interpreter, Charlane Ferretti, present throughout session to assist with communication.   Follow Up Recommendations  SNF     Equipment Recommendations  Rolling walker with 5"  wheels;Wheelchair cushion (measurements PT)    Recommendations for Other Services       Precautions / Restrictions Precautions Precautions: Fall Precaution Comments: No BP L UE Restrictions Weight Bearing Restrictions: Yes RLE Weight Bearing: Partial weight bearing    Mobility  Bed Mobility Overal bed mobility: Needs Assistance Bed Mobility: Supine to Sit     Supine to sit: Supervision     General bed mobility comments: increased time, elevated HOB and bedrails required to complete; refused to allow therapist to place bed in flat position or remove bedrails (to better simulate home environment)  Transfers Overall transfer level: Needs assistance Equipment used: Rolling walker (2 wheeled) Transfers: Sit to/from Stand Sit to Stand: Min assist         General transfer comment: pulls on RW for assist with lift off; very slow and deliberate; increased WBing L LE  Ambulation/Gait Ambulation/Gait assistance: Min assist Ambulation Distance (Feet): 20 Feet Assistive device: Rolling walker (2 wheeled)       General Gait Details: step to gait pattern; continues to slide R LE forward (vs step).  slight increase in R LE stance time/weight acceptance, but continues to rate pain 10/10 with all WBing efforts.   Stairs            Wheelchair Mobility    Modified Rankin (Stroke Patients Only)       Balance Overall balance assessment: Needs assistance Sitting-balance support: No upper extremity supported;Feet supported Sitting balance-Leahy Scale: Good     Standing balance support: Bilateral upper extremity supported Standing balance-Leahy Scale: Fair                      Cognition Arousal/Alertness: Awake/alert Behavior During  Therapy: WFL for tasks assessed/performed Overall Cognitive Status: Within Functional Limits for tasks assessed                      Exercises Other Exercises Other Exercises: Attempted stair training, but patient  unable/unwilling to attempt due to pain in R LE.  Max encouragement and additional therapist present for support-unable to redirect. Other Exercises: Attempted to discuss car transfers in preparation for discharge-patient unable to identify person to assist with discharge home; disinterested in discussion regarding car transfers, focused instead on "how they are going to send me home if I can't walk"    General Comments        Pertinent Vitals/Pain Pain Assessment: Faces Faces Pain Scale: Hurts worst Pain Location: R LE/hip with WBing Pain Descriptors / Indicators: Aching;Grimacing;Guarding Pain Intervention(s): Limited activity within patient's tolerance;Monitored during session;Premedicated before session;Repositioned;Patient requesting pain meds-RN notified    Home Living                      Prior Function            PT Goals (current goals can now be found in the care plan section) Acute Rehab PT Goals Patient Stated Goal: decrease pain PT Goal Formulation: With patient Time For Goal Achievement: 09/10/16 Potential to Achieve Goals: Fair Progress towards PT goals: Progressing toward goals    Frequency    7X/week      PT Plan Current plan remains appropriate    Co-evaluation             End of Session Equipment Utilized During Treatment: Gait belt Activity Tolerance: Patient tolerated treatment well;Patient limited by pain Patient left: in chair;with call bell/phone within reach;with chair alarm set   PT Visit Diagnosis: Muscle weakness (generalized) (M62.81);Difficulty in walking, not elsewhere classified (R26.2);Pain Pain - Right/Left: Right Pain - part of body: Hip     Time: [id number] PT Time Calculation (min) (ACUTE ONLY): 38 min  Charges:  $Gait Training: 23-37 mins $Therapeutic Activity: 8-22 mins                    G Codes:       Laury AxonKristen H Alita Waldren 08/30/2016, 10:56 AM

## 2016-08-30 NOTE — Progress Notes (Signed)
Hemodialysis completed. 

## 2016-08-30 NOTE — Care Management (Signed)
Dialysis center unable to provide wheelchair. RNCM got a charity wheelchair from case management department and gave to patient

## 2016-08-31 LAB — GLUCOSE, CAPILLARY
GLUCOSE-CAPILLARY: 259 mg/dL — AB (ref 65–99)
Glucose-Capillary: 157 mg/dL — ABNORMAL HIGH (ref 65–99)
Glucose-Capillary: 152 mg/dL — ABNORMAL HIGH (ref 65–99)
Glucose-Capillary: 117 mg/dL — ABNORMAL HIGH (ref 65–99)

## 2016-08-31 MED ORDER — OXYCODONE HCL ER 10 MG PO T12A
10.0000 mg | EXTENDED_RELEASE_TABLET | Freq: Two times a day (BID) | ORAL | Status: DC
  Administered 2016-08-31 – 2016-09-02 (×5): 10 mg via ORAL
  Filled 2016-08-31 (×5): qty 1

## 2016-08-31 MED ORDER — OXYCODONE HCL 5 MG PO TABS: 5.0000 mg | ORAL_TABLET | ORAL | Status: DC | PRN

## 2016-08-31 MED ORDER — HYDROCODONE-ACETAMINOPHEN 7.5-325 MG PO TABS: 1.0000 | ORAL_TABLET | Freq: Three times a day (TID) | ORAL | Status: DC | PRN

## 2016-08-31 NOTE — Progress Notes (Signed)
Physical Therapy Treatment Patient Details Name: Vanessa ShellingMaria D Medina Salazar MRN: [id number] DOB: 1956-03-31 Today's Date: 08/31/2016    History of Present Illness [age] y.o. female with a known history of DM, HTN, ESRD on HD, anemia, TIA here after fall at home suffering R pubic ramus fx (minimally displaced).  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    PT Comments    Pt able to progress to ambulating 70 feet with RW CGA to min assist.  Pt reporting 8/10 pain R pelvis with ambulation but pain decreased to 2/10 at rest sitting in chair end of session; pt reporting that although she still has a lot of pain with mobility, she does have improved pain control today (per chart, pt's pain medication recently adjusted).  Will continue to progress pt with strengthening, increasing independence with functional mobility, and increasing ambulation distance per pt tolerance.    Follow Up Recommendations  SNF     Equipment Recommendations  Rolling walker with 5" wheels;Wheelchair cushion (measurements PT);Wheelchair (measurements PT);3in1 (PT)    Recommendations for Other Services       Precautions / Restrictions Precautions Precautions: Fall Precaution Comments: No BP L UE Restrictions Weight Bearing Restrictions: Yes RLE Weight Bearing: Partial weight bearing Other Position/Activity Restrictions: must use assistive device    Mobility  Bed Mobility               General bed mobility comments: Deferred d/t pt in chair beginning and end of session  Transfers Overall transfer level: Needs assistance Equipment used: Rolling walker (2 wheeled) Transfers: Sit to/from Stand Sit to Stand: Min assist         General transfer comment: increased effort to stand and come to full upright posture; increased WB'ing L LE; B UE support on RW to stand  Ambulation/Gait Ambulation/Gait  assistance: Min guard;Min assist Ambulation Distance (Feet): 70 Feet Assistive device: Rolling walker (2 wheeled) Gait Pattern/deviations: Step-to pattern Gait velocity: decreased   General Gait Details: intermittent vc's required for R LE clearing floor when advancing (instead of sliding R LE forward on floor)   Stairs            Wheelchair Mobility    Modified Rankin (Stroke Patients Only)       Balance Overall balance assessment: Needs assistance Sitting-balance support: No upper extremity supported;Feet supported Sitting balance-Leahy Scale: Good     Standing balance support: Bilateral upper extremity supported (on RW) Standing balance-Leahy Scale: Good                      Cognition Arousal/Alertness: Awake/alert Behavior During Therapy: WFL for tasks assessed/performed Overall Cognitive Status: Within Functional Limits for tasks assessed                      Exercises      General Comments   Nursing cleared pt for participation in physical therapy.  Pt agreeable to PT session.  Spanish Interpreter Charlane FerrettiIsaias Ibanez present entire PT session.     Pertinent Vitals/Pain Pain Assessment: 0-10 Pain Score:  (0/10 at rest beginning of session; 8/10 with ambulation; 2/10 end of session) Pain Location: R LE/hip with WBing Pain Descriptors / Indicators: Aching;Grimacing;Guarding Pain Intervention(s): Limited activity within patient's tolerance;Monitored during session;Premedicated before session;Repositioned  Vitals (HR and O2 on room air) stable and WFL throughout treatment session.    Home Living  Prior Function            PT Goals (current goals can now be found in the care plan section) Acute Rehab PT Goals Patient Stated Goal: decrease pain PT Goal Formulation: With patient Time For Goal Achievement: 09/10/16 Potential to Achieve Goals: Fair Progress towards PT goals: Progressing toward goals     Frequency    7X/week      PT Plan Current plan remains appropriate    Co-evaluation             End of Session Equipment Utilized During Treatment: Gait belt Activity Tolerance: Patient limited by pain Patient left: in chair;with call bell/phone within reach;with chair alarm set;with family/visitor present Nurse Communication: Mobility status PT Visit Diagnosis: Muscle weakness (generalized) (M62.81);Difficulty in walking, not elsewhere classified (R26.2);Pain Pain - Right/Left: Right Pain - part of body: Hip     Time: 1610-9604 PT Time Calculation (min) (ACUTE ONLY): 32 min  Charges:  $Gait Training: 23-37 mins                    G CodesHendricks Limes, PT 08/31/16, 3:08 PM [phone]

## 2016-08-31 NOTE — Progress Notes (Signed)
[facility] - [place] at [facility]   PATIENT NAME: Vanessa Salazar    MR#:  [id number]  DATE OF BIRTH:  07-09-1955  SUBJECTIVE: Admitted for fall, pelvic fracture.still having lot of pain ,  . Not  motivated to work with physical therapy because of pain. Pt still having lof  Of pelvic pain 10/10 severity.  CHIEF COMPLAINT:   Chief Complaint  Patient presents with  . Fall  . Hip Pain    Still have pain. Sitting in chair today. Not confident, if she will be able to walk.  REVIEW OF SYSTEMS:   ROS CONSTITUTIONAL:  No fever, EYES: No blurred or double vision.  EARS, NOSE, AND THROAT: No tinnitus or ear pain.  RESPIRATORY: No cough, shortness of breath, wheezing or hemoptysis.  CARDIOVASCULAR: No chest pain, orthopnea, edema.  GASTROINTESTINAL: has  nausea,  No vomiting, diarrhea or abdominal pain.  GENITOURINARY:Hd patient does not make any urine. ENDOCRINE: No polyuria, nocturia,  HEMATOLOGY: No anemia, easy bruising or bleeding SKIN: No rash or lesion. MUSCULOSKELETAL: right pelvic pain, difficulty with ambulation. NEUROLOGIC: No tingling, numbness, weakness.  PSYCHIATRY: No anxiety or depression.   DRUG ALLERGIES:  No Known Allergies  VITALS:  Blood pressure (!) 162/47, pulse 66, temperature 98.1 F (36.7 C), temperature source Oral, resp. rate 18, height 5\' 2"  (1.575 m), weight 58.9 kg (129 lb 13.6 oz), last menstrual period 06/24/2005, SpO2 94 %.  PHYSICAL EXAMINATION:  GENERAL:  61 y.o.-year-old patient lying in the bed with no acute distress. Overall ill-appearing female. Moaning in pain. EYES: Pupils equal, round, reactive to light and accommodation. No scleral icterus. Extraocular muscles intact.  HEENT: Head atraumatic, normocephalic. Oropharynx and nasopharynx clear.  NECK:  Supple, no jugular venous distention. No thyroid enlargement, no tenderness.  LUNGS: Normal breath sounds bilaterally, no wheezing, rales,rhonchi or crepitation.  No use of accessory muscles of respiration.  CARDIOVASCULAR: S1, S2 normal. No murmurs, rubs, or gallops.  ABDOMEN: Soft, nontender, nondistended. Bowel sounds present. No organomegaly or mass.  EXTREMITIES: Patient has tenderness to palpation over the right hip area, decreased range of motion secondary to pain in the right hip. NEUROLOGIC: Cranial nerves II through XII are intact. Muscle strength 5/5 in all extremities. Sensation intact. Gait not checked.  PSYCHIATRIC: The patient is alert and oriented x 3.  SKIN: No obvious rash, lesion, or ulcer.    LABORATORY PANEL:   CBC  Recent Labs Lab 08/28/16 0403  WBC 4.6  HGB 7.5*  HCT 22.5*  PLT 83*   ------------------------------------------------------------------------------------------------------------------  Chemistries   Recent Labs Lab 08/26/16 0406 08/28/16 0403  NA 137 135  K 5.8* 5.2*  CL 104 93*  CO2 22 28  GLUCOSE 188* 158*  BUN 62* 45*  CREATININE 9.22* 6.62*  CALCIUM 8.5* 8.3*  AST 20  --   ALT 11*  --   ALKPHOS 115  --   BILITOT 0.6  --    ------------------------------------------------------------------------------------------------------------------  Cardiac Enzymes No results for input(s): TROPONINI in the last 168 hours. ------------------------------------------------------------------------------------------------------------------  RADIOLOGY:  No results found.  EKG:   Orders placed or performed during the hospital encounter of 08/25/16  . EKG 12-Lead    ASSESSMENT AND PLAN:   #1. right superior pubic ramus fracture,  fracture at the inferior pubic pubic ramus  and pubic symphysis'. Patient having significant pain in the right hip area.seen by ortho ,conservative management advised.seen by PT .recommended SNF. But due to lack of insurance ,pt cant go to SNF>needs to  go home but has limited support.so pt has no safe d/c plan due to difficulty ambulation and high risk for falls and no  support system at home. I spoke with patient's son Mr.  Felipa FurnaceGarcia today and explained that she needs support at home. So significant pain and unable to ambulate , she is not confident , how will she be able to walk to [place] and then in dialysis center. I added long acting oxycodone.  #2 ESRD on hemodialysis Monday, Wednesday, Friday, has hyperkalemia.nephro  On board.  .#3 essential hypertension; BP better controlled.  #4. Diabetes mellitus type 2 ;resolved hypoglycemia.continue SSI with coverage.  #5 .fever;resolved.  Used  the [place] interpreter.  Hypoxia due to pain;explained how to use this spirometry with help of spanish translator.  Discussed with patient, discussed with nurse. Dispo;' home tomorrow.  All the records are reviewed and case discussed with Care Management/Social Workerr. Management plans discussed with the patient, family and they are in agreement.  CODE STATUS: full  TOTAL TIME TAKING CARE OF THIS PATIENT: 35 minutes.   POSSIBLE D/C IN 2-3 DAYS, DEPENDING ON CLINICAL CONDITION.   Altamese DillingVACHHANI, Harrol Novello M.D on 08/31/2016 at 2:15 PM  Between 7am to 6pm - Pager - [phone]  After 6pm go to [email or web address] - password [email or web address] [facility]  [facility]  Office  [phone]  CC: Primary care physician; No PCP Per Patient   Note: This dictation was prepared with Dragon dictation along with smaller phrase technology. Any transcriptional errors that result from this process are unintentional.

## 2016-08-31 NOTE — Progress Notes (Signed)
Date: 08/31/2016,   MRN# [id number] Vanessa Salazar August 02, 1955 Code Status:     Code Status Orders        Start     Ordered   08/26/16 0056  Full code  Continuous     08/26/16 0055    Code Status History    Date Active Date Inactive Code Status Order ID Comments User Context   This patient has a current code status but no historical code status.     Hosp day:@LENGTHOFSTAYDAYS @ Referring MD: @ATDPROV @      CC: right pleural effusion  HPI: This is a pleasant lady 61 years old here with increase pain, s/p fall, fractured her pelvic. She is on dialysis 3 x a week. Asked to see regarding possible right pleural effusion. It is not clear if she hit her chest. On review of the chest xray there is more vascular congestion and if present a very tiny right effusion. She is not c/o pleurisy, shortness of breath, chest pain or fever.   PMHX:   Past Medical History:  Diagnosis Date  . Diabetes mellitus without complication (HCC)   . ESRD on hemodialysis (HCC) 2007  . Hypertension    Surgical Hx:  Past Surgical History:  Procedure Laterality Date  . CESAREAN SECTION     X3  . EYE SURGERY     Family Hx:  Family History  Problem Relation Age of Onset  . Diabetes Father    Social Hx:   Social History  Substance Use Topics  . Smoking status: Never Smoker  . Smokeless tobacco: Never Used  . Alcohol use No   Medication:    Home Medication:    Current Medication: @CURMEDTAB @   Allergies:  Patient has no known allergies.  Review of Systems: per report Gen:  Denies  fever, sweats, chills HEENT: Denies blurred vision, double vision, ear pain, eye pain, hearing loss, nose bleeds, sore throat Cvc:  No dizziness, chest pain or heaviness Resp:   No distress Gi: Denies swallowing difficulty, stomach pain, nausea or vomiting, diarrhea, constipation, bowel incontinence Gu:  Denies bladder incontinence, burning urine Ext:   No Joint pain, stiffness or swelling Skin: No skin  rash, easy bruising or bleeding or hives Endoc:  No polyuria, polydipsia , polyphagia or weight change Psych: No depression, insomnia or hallucinations  Other:  All other systems negative  Physical Examination:   VS: BP (!) 164/55 (BP Location: Right Arm)   Pulse 66   Temp 98.1 F (36.7 C) (Oral)   Resp 18   Ht 5\' 2"  (1.575 m)   Wt 129 lb 13.6 oz (58.9 kg)   LMP 06/24/2005 (LMP Unknown) Comment: Pt reports it was in the same year she began Dialysis  SpO2 97%   BMI 23.75 kg/m   General Appearance: No distress, sitting in chair  Neuro: without focal findings, mental status, speech normal, alert and oriented, cranial nerves 2-12 intact,  NECK: Supple HEENT: PERRLA, EOM intact, no ptosis, no other lesions noticed: Pulmonary:.no respiratory distress Cardiovascular:  Normal S1,S2.  No m/r/g.   Skin:   warm, no rashes, no ecchymosis  Extremities: normal, no cyanosis, clubbing, no edema, warm with normal capillary refill.    Labs results:   No results for input(s): HGB, HCT, MCV, WBC, POTASSIUM, CHLORIDE, BUN, CREATININE, GLUCOSE, CALCIUM, INR, PTT in the last 72 hours.  Invalid input(s): PLATELET, BANDS, NEUTROPHIL, LYMPHOCYTE, MONOCYTE, EOSINOPHILS, BASOPHIL, SODIUM, BICARBONATE, MAGNESIUM, PHOSPHORUS, PT, SGPT, SGOT,     Rad results:  EXAM: CHEST 1 VIEW  COMPARISON:  09/09/2011  FINDINGS: Cardiac shadow is enlarged. Vascular congestion is noted with findings suggestive of a right-sided pleural effusion. No focal confluent infiltrate is seen. No bony abnormality is noted.  IMPRESSION: Changes consistent with CHF and right pleural effusion. No focal infiltrate is noted.   Electronically Signed   By: Alcide CleverMark  Lukens M.D.   On: 08/27/2016 14:50   Assessment and Plan: Asked to see regarding possible right pleural effusion. Her chest xray showed more vascular congestion and if present a tiny pleural effusion. She is not symptomatic, she is on dialysis 3 x a week. No  pleurisy. Does not appear she hit her chest. She did fracture her pelvic as outlined.   Pulmonary wise she does not need thoracentesis, serial xrays is ok and extra fluid can be removed during dialysis as seen fit by renal.     I have personally obtained a history, examined the patient, evaluated laboratory and imaging results, formulated the assessment and plan and placed orders.  The Patient requires high complexity decision making for assessment and support, frequent evaluation and titration of therapies, application of advanced monitoring technologies and extensive interpretation of multiple databases.   Herbon Fleming,M.D. Pulmonary & Critical care Medicine [facility]

## 2016-08-31 NOTE — Progress Notes (Signed)
Pt alert and oriented. Scheduled pain medication given with good results. Incentive spirometer encouraged.

## 2016-09-01 LAB — GLUCOSE, CAPILLARY
GLUCOSE-CAPILLARY: 268 mg/dL — AB (ref 65–99)
Glucose-Capillary: 167 mg/dL — ABNORMAL HIGH (ref 65–99)
Glucose-Capillary: 169 mg/dL — ABNORMAL HIGH (ref 65–99)
Glucose-Capillary: 140 mg/dL — ABNORMAL HIGH (ref 65–99)

## 2016-09-01 LAB — CBC
HCT: 21.1 % — ABNORMAL LOW (ref 35.0–47.0)
Hemoglobin: 7.3 g/dL — ABNORMAL LOW (ref 12.0–16.0)
MCH: 34.1 pg — ABNORMAL HIGH (ref 26.0–34.0)
MCHC: 34.7 g/dL (ref 32.0–36.0)
MCV: 98.4 fL (ref 80.0–100.0)
PLATELETS: 125 10*3/uL — AB (ref 150–440)
RBC: 2.14 MIL/uL — ABNORMAL LOW (ref 3.80–5.20)
RDW: 17.6 % — AB (ref 11.5–14.5)
WBC: 2.8 10*3/uL — AB (ref 3.6–11.0)

## 2016-09-01 LAB — CULTURE, BLOOD (ROUTINE X 2)
CULTURE: NO GROWTH
Culture: NO GROWTH

## 2016-09-01 NOTE — Progress Notes (Signed)
Subjective:    conversation though spanish interpreter States she is eating better than before No acute c/o, Nausea or SOB Pain control is fair  person who was going to take care of her at home is now sick and also admitted to the hospital  Objective:  Vital signs in last 24 hours:  Temp:  [98.1 F (36.7 C)-98.6 F (37 C)] 98.1 F (36.7 C) (03/11 0718) Pulse Rate:  [66-72] 70 (03/11 0718) Resp:  [18] 18 (03/11 0718) BP: (136-162)/(47-57) 158/57 (03/11 0718) SpO2:  [93 %-99 %] 99 % (03/11 0718)  Weight change:  Filed Weights   08/26/16 1355 08/28/16 0950 08/28/16 1327  Weight: 64.9 kg (143 lb) 59.8 kg (131 lb 13.4 oz) 58.9 kg (129 lb 13.6 oz)    Intake/Output:    Intake/Output Summary (Last 24 hours) at 09/01/16 1126 Last data filed at 09/01/16 0800  Gross per 24 hour  Intake              240 ml  Output                0 ml  Net              240 ml     Physical Exam: General: NAD  HEENT anicteric  Neck supple  Pulm/lungs Clear, normal effort  CVS/Heart No rub or gallop  Abdomen:  Soft, NT  Extremities: Trace edema  Neurologic: Resting quietly  Skin: Warm, dry  Access: AVF        Basic Metabolic Panel:   Recent Labs Lab 08/25/16 2204 08/26/16 0406 08/28/16 0403  NA 137 137 135  K 5.8* 5.8* 5.2*  CL 103 104 93*  CO2 20* 22 28  GLUCOSE 270* 188* 158*  BUN 57* 62* 45*  CREATININE 8.75* 9.22* 6.62*  CALCIUM 9.1 8.5* 8.3*     CBC:  Recent Labs Lab 08/25/16 2204 08/26/16 0406 08/28/16 0403 09/01/16 0404  WBC 5.2 5.4 4.6 2.8*  NEUTROABS 4.4  --   --   --   HGB 9.0* 7.9* 7.5* 7.3*  HCT 27.5* 23.3* 22.5* 21.1*  MCV 99.6 99.7 98.5 98.4  PLT 136* 112* 83* 125*      Microbiology:  Recent Results (from the past 720 hour(s))  CULTURE, BLOOD (ROUTINE X 2) w Reflex to ID Panel     Status: None   Collection Time: 08/27/16  2:05 PM  Result Value Ref Range Status   Specimen Description BLOOD RIGHT FA  Final   Special Requests BOTTLES DRAWN  AEROBIC AND ANAEROBIC BCAV  Final   Culture NO GROWTH 5 DAYS  Final   Report Status 09/01/2016 FINAL  Final  CULTURE, BLOOD (ROUTINE X 2) w Reflex to ID Panel     Status: None   Collection Time: 08/27/16  2:05 PM  Result Value Ref Range Status   Specimen Description BLOOD RIGHT HAND  Final   Special Requests BOTTLES DRAWN AEROBIC AND ANAEROBIC BCAV  Final   Culture NO GROWTH 5 DAYS  Final   Report Status 09/01/2016 FINAL  Final    Coagulation Studies: No results for input(s): LABPROT, INR in the last 72 hours.  Urinalysis: No results for input(s): COLORURINE, LABSPEC, PHURINE, GLUCOSEU, HGBUR, BILIRUBINUR, KETONESUR, PROTEINUR, UROBILINOGEN, NITRITE, LEUKOCYTESUR in the last 72 hours.  Invalid input(s): APPERANCEUR    Imaging: No results found.   Medications:    . amLODipine  10 mg Oral QHS  . atorvastatin  20 mg Oral QPM  . cinacalcet  30 mg Oral Q breakfast  . docusate sodium  100 mg Oral BID  . epoetin (EPOGEN/PROCRIT) injection  4,000 Units Intravenous Q M,W,F-HD  . feeding supplement (NEPRO CARB STEADY)  237 mL Oral BID BM  . furosemide  80 mg Oral Daily  . heparin  5,000 Units Subcutaneous Q8H  . hydrALAZINE  100 mg Oral BID  . ibuprofen  400 mg Oral TID  . insulin aspart  0-5 Units Subcutaneous QHS  . insulin aspart  0-9 Units Subcutaneous TID WC  . lidocaine  1 patch Transdermal Daily  . losartan  100 mg Oral QHS  . methocarbamol  500 mg Oral TID  . oxyCODONE  10 mg Oral Q12H  . polyethylene glycol  17 g Oral Daily  . sevelamer carbonate  1,600 mg Oral TID WC & HS   acetaminophen **OR** acetaminophen, albuterol, bisacodyl, HYDROcodone-acetaminophen, ipratropium, ondansetron **OR** ondansetron (ZOFRAN) IV, oxyCODONE, senna-docusate  Assessment/ Plan:  61 y.o. female with ESRD, diabetes, hypertension, anemia, TIA, who [facility] for evaluation of right hip pain after a fall.   [address], MWF, 3 hrs, [facility]  1.   End-stage renal disease HD MWF  2.  Hypertension Blood pressure is better controlled Amlodipine at night Added losartan at night  3.  Anemia of chronic kidney disease Hemoglobin 7.3 t sat 77%, ferritin 1333 EPO with HD  4.  Hyperkalemia Low K diet     LOS: 7 Tahj Njoku 3/11/201811:26 AM

## 2016-09-01 NOTE — Progress Notes (Signed)
[facility] [place] at [facility]   PATIENT NAME: Vanessa KindredMaria Medina Salazar    MR#:  [id number]  DATE OF BIRTH:  1955/11/24  CHIEF COMPLAINT:   Chief Complaint  Patient presents with  . Fall  . Hip Pain    Still have pain. Sitting in chair today. Better controlled with long acting meds.She is feeling much pain while she is resting and not moving, but I encouraged her to walk and move around so that we know about her pain control and can adjust the medications. Her husband is also admitted to the hospital now and so she is worried how she will be able to stay alone at home when she goes.  REVIEW OF SYSTEMS:   ROS CONSTITUTIONAL:  No fever, EYES: No blurred or double vision.  EARS, NOSE, AND THROAT: No tinnitus or ear pain.  RESPIRATORY: No cough, shortness of breath, wheezing or hemoptysis.  CARDIOVASCULAR: No chest pain, orthopnea, edema.  GASTROINTESTINAL: has  nausea,  No vomiting, diarrhea or abdominal pain.  GENITOURINARY:Hd patient does not make any urine. ENDOCRINE: No polyuria, nocturia,  HEMATOLOGY: No anemia, easy bruising or bleeding SKIN: No rash or lesion. MUSCULOSKELETAL: right pelvic pain, difficulty with ambulation. NEUROLOGIC: No tingling, numbness, weakness.  PSYCHIATRY: No anxiety or depression.   DRUG ALLERGIES:  No Known Allergies  VITALS:  Blood pressure (!) 158/57, pulse 70, temperature 98.1 F (36.7 C), temperature source Oral, resp. rate 18, height 5\' 2"  (1.575 m), weight 58.9 kg (129 lb 13.6 oz), last menstrual period 06/24/2005, SpO2 98 %.  PHYSICAL EXAMINATION:  GENERAL:  61 y.o.-year-old patient lying in the bed with no acute distress. Overall ill-appearing female. Moaning in pain. EYES: Pupils equal, round, reactive to light and accommodation. No scleral icterus. Extraocular muscles intact.  HEENT: Head atraumatic, normocephalic. Oropharynx and nasopharynx clear.  NECK:  Supple, no jugular venous distention. No thyroid  enlargement, no tenderness.  LUNGS: Normal breath sounds bilaterally, no wheezing, rales,rhonchi or crepitation. No use of accessory muscles of respiration.  CARDIOVASCULAR: S1, S2 normal. No murmurs, rubs, or gallops.  ABDOMEN: Soft, nontender, nondistended. Bowel sounds present. No organomegaly or mass.  EXTREMITIES: Patient has tenderness to palpation over the right hip area, decreased range of motion secondary to pain in the right hip. NEUROLOGIC: Cranial nerves II through XII are intact. Muscle strength 5/5 in all extremities. Sensation intact. Gait not checked.  PSYCHIATRIC: The patient is alert and oriented x 3.  SKIN: No obvious rash, lesion, or ulcer.    LABORATORY PANEL:   CBC  Recent Labs Lab 09/01/16 0404  WBC 2.8*  HGB 7.3*  HCT 21.1*  PLT 125*   ------------------------------------------------------------------------------------------------------------------  Chemistries   Recent Labs Lab 08/26/16 0406 08/28/16 0403  NA 137 135  K 5.8* 5.2*  CL 104 93*  CO2 22 28  GLUCOSE 188* 158*  BUN 62* 45*  CREATININE 9.22* 6.62*  CALCIUM 8.5* 8.3*  AST 20  --   ALT 11*  --   ALKPHOS 115  --   BILITOT 0.6  --    ------------------------------------------------------------------------------------------------------------------  Cardiac Enzymes No results for input(s): TROPONINI in the last 168 hours. ------------------------------------------------------------------------------------------------------------------  RADIOLOGY:  No results found.  EKG:   Orders placed or performed during the hospital encounter of 08/25/16  . EKG 12-Lead    ASSESSMENT AND PLAN:   #1. right superior pubic ramus fracture,  fracture at the inferior pubic pubic ramus  and pubic symphysis'. Patient having significant pain in the right hip  area.seen by ortho ,conservative management advised.seen by PT .recommended SNF. But due to lack of insurance ,pt cant go to SNF>needs to go home  but has limited support.so pt has no safe d/c plan due to difficulty ambulation and high risk for falls and no support system at home. I added long acting oxycodone.  Now she don't have much pain while resting but movements and activities cause her pain, I encouraged her still to keep moving and do activities so we can up titrate the pain medication if needed.  #2 ESRD on hemodialysis Monday, Wednesday, Friday, has hyperkalemia.nephro  On board.  .#3 essential hypertension; BP better controlled.  #4. Diabetes mellitus type 2 ;resolved hypoglycemia.continue SSI with coverage.  #5 .fever;resolved.  Used  the [place] interpreter.  Hypoxia due to pain;explained how to use this spirometry with help of spanish translator.  Discussed with patient, discussed with nurse. Dispo;' home tomorrow.  All the records are reviewed and case discussed with Care Management/Social Workerr. Management plans discussed with the patient, family and they are in agreement.  CODE STATUS: full  TOTAL TIME TAKING CARE OF THIS PATIENT: 35 minutes.   POSSIBLE D/C IN 2-3 DAYS, DEPENDING ON CLINICAL CONDITION.   Altamese Dilling M.D on 09/01/2016 at 1:21 PM  Between 7am to 6pm - Pager - [phone]  After 6pm go to [email or web address] - password [email or web address] [facility]  [facility]  Office  [phone]  CC: Primary care physician; No PCP Per Patient   Note: This dictation was prepared with Dragon dictation along with smaller phrase technology. Any transcriptional errors that result from this process are unintentional.

## 2016-09-01 NOTE — Plan of Care (Signed)
Problem: Safety: Goal: Ability to remain free from injury will improve Outcome: Progressing Patient calls out for assistance if needed

## 2016-09-01 NOTE — Progress Notes (Signed)
Physical Therapy Treatment Patient Details Name: Sarah Baez MRN: [id number] DOB: 16-Feb-1956 Today's Date: 09/01/2016    History of Present Illness 61 y.o. female with a known history of DM, HTN, ESRD on HD, anemia, TIA here after fall at home suffering R pubic ramus fx (minimally displaced).  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    PT Comments    Pt requesting to toilet beginning of session and then after toileting, ambulation limited (to 35 feet with RW) d/t pt suddenly needing to toilet again (pt sat down in chair and brought back to room quickly and transferred to bedside commode but pt with bowel incontinence; nursing reports pt being given medications earlier today to facilitate bowel movement).  R hip pain 8/10 with ambulation and 9/10 end of session (nursing gave pt pain meds end of session).  O2 93% or greater on room air during session (nursing reports pt needing O2 recently when resting in bed).  Per notes, the person who was able to assist pt upon discharge is now in hospital and currently unable to assist pt on discharge. Will continue to progress pt with strengthening, balance, increasing ambulation distance, and decreasing assist with functional mobility.    Follow Up Recommendations  SNF     Equipment Recommendations  Rolling walker with 5" wheels;Wheelchair cushion (measurements PT);Wheelchair (measurements PT);3in1 (PT)    Recommendations for Other Services       Precautions / Restrictions Precautions Precautions: Fall Precaution Comments: No BP L UE Restrictions Weight Bearing Restrictions: Yes RLE Weight Bearing: Partial weight bearing Other Position/Activity Restrictions: must use assistive device    Mobility  Bed Mobility               General bed mobility comments: Deferred d/t pt in chair beginning and end of  session  Transfers Overall transfer level: Needs assistance Equipment used: Rolling walker (2 wheeled) Transfers: Sit to/from Stand Sit to Stand: Min guard         General transfer comment: x2 trials from commode; x2 trials from bedside chair; B UE support on RW to stand  Ambulation/Gait Ambulation/Gait assistance: Min guard Ambulation Distance (Feet): 35 Feet Assistive device: Rolling walker (2 wheeled) Gait Pattern/deviations: Step-to pattern Gait velocity: decreased   General Gait Details: occasional vc's required for R LE clearing floor when advancing (instead of sliding R LE forward on floor)   Stairs            Wheelchair Mobility    Modified Rankin (Stroke Patients Only)       Balance Overall balance assessment: Needs assistance Sitting-balance support: No upper extremity supported;Feet supported Sitting balance-Leahy Scale: Good     Standing balance support: Bilateral upper extremity supported (on RW) Standing balance-Leahy Scale: Good Standing balance comment: with ambulation                    Cognition Arousal/Alertness: Awake/alert Behavior During Therapy: WFL for tasks assessed/performed Overall Cognitive Status: Within Functional Limits for tasks assessed                      Exercises      General Comments   Nursing cleared pt for participation in physical therapy.  Pt agreeable to PT session.  Spanish Interpreter Charlane Ferretti present for session.  Pt requiring assist for clean-up end of session d/t bowel incontinence.  Pertinent Vitals/Pain Pain Assessment: 0-10 Pain Score: 9  Pain Descriptors / Indicators: Aching;Grimacing;Guarding Pain Intervention(s): Limited activity within patient's tolerance;Monitored during session;Premedicated before session;Repositioned;Patient requesting pain meds-RN notified (RN gave pt pain meds end of session)  Vitals (HR and O2 on room air) stable and WFL throughout treatment session.     Home Living                      Prior Function            PT Goals (current goals can now be found in the care plan section) Acute Rehab PT Goals Patient Stated Goal: decrease pain PT Goal Formulation: With patient Time For Goal Achievement: 09/10/16 Potential to Achieve Goals: Fair Progress towards PT goals: Progressing toward goals    Frequency    7X/week      PT Plan Current plan remains appropriate    Co-evaluation             End of Session Equipment Utilized During Treatment: Gait belt Activity Tolerance: Patient limited by pain;Other (comment) (Limited d/t needing to toilet) Patient left: in chair;with call bell/phone within reach;with chair alarm set;with nursing/sitter in room Nurse Communication: Mobility status PT Visit Diagnosis: Muscle weakness (generalized) (M62.81);Difficulty in walking, not elsewhere classified (R26.2);Pain Pain - Right/Left: Right Pain - part of body: Hip     Time: [id number] PT Time Calculation (min) (ACUTE ONLY): 42 min  Charges:  $Gait Training: 8-22 mins $Therapeutic Activity: 23-37 mins                    G CodesHendricks Limes:       Aidee Latimore, PT 09/01/16, 3:55 PM [phone]

## 2016-09-01 NOTE — Progress Notes (Signed)
Patient oxygen level was 82 on room air, instructed patient on incentive spirometer and cough and deep breathing. Oxygen sats went up to 88. Oxygen 2l liter via nasal canula applied, oxygen sats are now 98. Patient educated on incentive spirometer and importance of mobility.

## 2016-09-01 NOTE — Progress Notes (Signed)
Pt sating at 93% on rm air at beginning of shift. O2 desat to 70s at 0425 VS. Placed on 2 L O2 via cannula bringing pt O2 back up to 98%. No complaints, Denies pain throughout shift with scheduled pain medications. Pt communicated that she has not had a bowel movement since the 4th of March and is not in pain but is passing little gas. Bowel sounds are active. Stool softener and Dulcolax administered. Communicated using interpreter via phone.

## 2016-09-02 LAB — ABO/RH: ABO/RH(D): O POS

## 2016-09-02 LAB — GLUCOSE, CAPILLARY
GLUCOSE-CAPILLARY: 148 mg/dL — AB (ref 65–99)
Glucose-Capillary: 178 mg/dL — ABNORMAL HIGH (ref 65–99)

## 2016-09-02 LAB — PREPARE RBC (CROSSMATCH)

## 2016-09-02 MED ORDER — METHOCARBAMOL 500 MG PO TABS
500.0000 mg | ORAL_TABLET | Freq: Three times a day (TID) | ORAL | 0 refills | Status: AC | PRN
Start: 2016-09-02 — End: ?

## 2016-09-02 MED ORDER — SODIUM CHLORIDE 0.9 % IV SOLN: Freq: Once | INTRAVENOUS | Status: DC

## 2016-09-02 MED ORDER — METHOCARBAMOL 500 MG PO TABS: 500.0000 mg | ORAL_TABLET | Freq: Three times a day (TID) | ORAL | 0 refills | Status: DC | PRN

## 2016-09-02 MED ORDER — IBUPROFEN 400 MG PO TABS: 400.0000 mg | ORAL_TABLET | Freq: Four times a day (QID) | ORAL | 0 refills | Status: AC | PRN

## 2016-09-02 MED ORDER — LIDOCAINE 5 % EX PTCH: 1.0000 | MEDICATED_PATCH | Freq: Every day | CUTANEOUS | 0 refills | Status: AC

## 2016-09-02 MED ORDER — HYDROCODONE-ACETAMINOPHEN 7.5-325 MG PO TABS: 1.0000 | ORAL_TABLET | Freq: Three times a day (TID) | ORAL | 0 refills | Status: DC | PRN

## 2016-09-02 MED ORDER — OXYCODONE HCL ER 10 MG PO T12A: 10.0000 mg | EXTENDED_RELEASE_TABLET | Freq: Two times a day (BID) | ORAL | 0 refills | Status: DC

## 2016-09-02 MED ORDER — ENSURE ENLIVE PO LIQD: 237.0000 mL | Freq: Two times a day (BID) | ORAL | Status: DC

## 2016-09-02 NOTE — Progress Notes (Signed)
Initial Nutrition Assessment  DOCUMENTATION CODES:   Not applicable  INTERVENTION:  1. Ensure Enlive po BID, each supplement provides 350 kcal and 20 grams of protein  NUTRITION DIAGNOSIS:   Inadequate oral intake related to poor appetite as evidenced by per patient/family report.  GOAL:   Patient will meet greater than or equal to 90% of their needs  MONITOR:   PO intake, I & O's, Labs, Supplement acceptance, Weight trends  REASON FOR ASSESSMENT:   Consult Assessment of nutrition requirement/status  ASSESSMENT:   Vanessa Salazar is a 61 y.o. female with a known history of DM, HTN, ESRD on HD, anemia, TIA was in a usual state of health until this evening when she sustained a mechanical fall landing on her right hip.  Spoke with patient with aid of spanish interpreter ID [phone] [id number] Patient states she had only drank 1 ensure over the past 2 days, had eaten nothing else. No acute complaints. No issues chewing/swallowing/choking Weight stable. Wanted ensure, was receiving NePro. Labs and medications reviewed. Nutrition-Focused physical exam completed. Findings are no fat depletion, no muscle depletion, and no edema.   Diet Order:  Diet heart healthy/carb modified Room service appropriate? Yes; Fluid consistency: Thin  Skin:  Reviewed, no issues  Last BM:  09/01/2016  Height:   Ht Readings from Last 1 Encounters:  08/25/16 5\' 2"  (1.575 m)    Weight:   Wt Readings from Last 1 Encounters:  08/28/16 129 lb 13.6 oz (58.9 kg)    Ideal Body Weight:  50 kg  BMI:  Body mass index is 23.75 kg/m.  Estimated Nutritional Needs:   Kcal:  1465-1800 calories  Protein:  70-80 gm  Fluid:  UOP + 1000cc  EDUCATION NEEDS:   No education needs identified at this time  Dionne AnoWilliam M. Amadi Yoshino, MS, RD LDN Inpatient Clinical Dietitian Pager [phone]

## 2016-09-02 NOTE — Discharge Instructions (Signed)
[facility] - [place] at [facility] ° °DIET:  °Renal diet ° °DISCHARGE CONDITION:  °Stable ° °ACTIVITY:  °Activity as tolerated ° °OXYGEN:  °Home Oxygen: No. °  °Oxygen Delivery: room air ° °DISCHARGE LOCATION:  °home  ° ° °ADDITIONAL DISCHARGE INSTRUCTION: ° ° °If you experience worsening of your admission symptoms, develop shortness of breath, life threatening emergency, suicidal or homicidal thoughts you must seek medical attention immediately by calling 911 or calling your MD immediately  if symptoms less severe. ° °You Must read complete instructions/literature along with all the possible adverse reactions/side effects for all the Medicines you take and that have been prescribed to you. Take any new Medicines after you have completely understood and accpet all the possible adverse reactions/side effects.  ° °Please note ° °You were cared for by a hospitalist during your hospital stay. If you have any questions about your discharge medications or the care you received while you were in the hospital after you are discharged, you can call the unit and asked to speak with the hospitalist on call if the hospitalist that took care of you is not available. Once you are discharged, your primary care physician will handle any further medical issues. Please note that NO REFILLS for any discharge medications will be authorized once you are discharged, as it is imperative that you return to your primary care physician (or establish a relationship with a primary care physician if you do not have one) for your aftercare needs so that they can reassess your need for medications and monitor your lab values. ° ° °

## 2016-09-02 NOTE — Progress Notes (Signed)
  End of hd 

## 2016-09-02 NOTE — Progress Notes (Signed)
Report from dialysis. Removed one liter, and gave one unit of blood without complications. Obtained blood sugar and VS once returned to room.

## 2016-09-02 NOTE — Progress Notes (Signed)
Post hd vitals 

## 2016-09-02 NOTE — Progress Notes (Signed)
Pt unable to dialyze in chair. Per interpreter pt has pain 10/10 d/t fracture with any type of movement. Pt. Refused to be moved to chair for HD tx. Lateef MD notified.

## 2016-09-02 NOTE — Progress Notes (Signed)
Subjective:  Patient seen and evaluated during hemodialysis.  She was unable to sit up in a chair. Therefore she is being dialyzed in the bed again. We explained to the patient that she will be unable to be discharged as we can't perform stretcher dialysis as an outpt.    Objective:  Vital signs in last 24 hours:  Temp:  [98 F (36.7 C)-98.7 F (37.1 C)] 98 F (36.7 C) (03/12 1145) Pulse Rate:  [66-87] 85 (03/12 1145) Resp:  [10-21] 21 (03/12 1145) BP: (112-169)/(40-81) 159/66 (03/12 1145) SpO2:  [91 %-97 %] 93 % (03/12 1145)  Weight change:  Filed Weights   08/26/16 1355 08/28/16 0950 08/28/16 1327  Weight: 64.9 kg (143 lb) 59.8 kg (131 lb 13.4 oz) 58.9 kg (129 lb 13.6 oz)    Intake/Output:    Intake/Output Summary (Last 24 hours) at 09/02/16 1154 Last data filed at 09/02/16 1145  Gross per 24 hour  Intake              550 ml  Output                0 ml  Net              550 ml     Physical Exam: General: NAD  HEENT anicteric  Neck supple  Pulm/lungs Clear, normal effort  CVS/Heart S1S2 no rubs  Abdomen:  Soft, NTND, BS present  Extremities: Trace edema  Neurologic: Awake, alert, follows commands  Skin: Warm, dry  Access: AVF        Basic Metabolic Panel:   Recent Labs Lab 08/28/16 0403  NA 135  K 5.2*  CL 93*  CO2 28  GLUCOSE 158*  BUN 45*  CREATININE 6.62*  CALCIUM 8.3*     CBC:  Recent Labs Lab 08/28/16 0403 09/01/16 0404  WBC 4.6 2.8*  HGB 7.5* 7.3*  HCT 22.5* 21.1*  MCV 98.5 98.4  PLT 83* 125*      Microbiology:  Recent Results (from the past 720 hour(s))  CULTURE, BLOOD (ROUTINE X 2) w Reflex to ID Panel     Status: None   Collection Time: 08/27/16  2:05 PM  Result Value Ref Range Status   Specimen Description BLOOD RIGHT FA  Final   Special Requests BOTTLES DRAWN AEROBIC AND ANAEROBIC BCAV  Final   Culture NO GROWTH 5 DAYS  Final   Report Status 09/01/2016 FINAL  Final  CULTURE, BLOOD (ROUTINE X 2) w Reflex to ID  Panel     Status: None   Collection Time: 08/27/16  2:05 PM  Result Value Ref Range Status   Specimen Description BLOOD RIGHT HAND  Final   Special Requests BOTTLES DRAWN AEROBIC AND ANAEROBIC BCAV  Final   Culture NO GROWTH 5 DAYS  Final   Report Status 09/01/2016 FINAL  Final    Coagulation Studies: No results for input(s): LABPROT, INR in the last 72 hours.  Urinalysis: No results for input(s): COLORURINE, LABSPEC, PHURINE, GLUCOSEU, HGBUR, BILIRUBINUR, KETONESUR, PROTEINUR, UROBILINOGEN, NITRITE, LEUKOCYTESUR in the last 72 hours.  Invalid input(s): APPERANCEUR    Imaging: No results found.   Medications:    . sodium chloride   Intravenous Once  . amLODipine  10 mg Oral QHS  . atorvastatin  20 mg Oral QPM  . cinacalcet  30 mg Oral Q breakfast  . docusate sodium  100 mg Oral BID  . epoetin (EPOGEN/PROCRIT) injection  4,000 Units Intravenous Q M,W,F-HD  . feeding supplement (  NEPRO CARB STEADY)  237 mL Oral BID BM  . furosemide  80 mg Oral Daily  . heparin  5,000 Units Subcutaneous Q8H  . hydrALAZINE  100 mg Oral BID  . ibuprofen  400 mg Oral TID  . insulin aspart  0-5 Units Subcutaneous QHS  . insulin aspart  0-9 Units Subcutaneous TID WC  . lidocaine  1 patch Transdermal Daily  . losartan  100 mg Oral QHS  . methocarbamol  500 mg Oral TID  . oxyCODONE  10 mg Oral Q12H  . polyethylene glycol  17 g Oral Daily  . sevelamer carbonate  1,600 mg Oral TID WC & HS   acetaminophen **OR** acetaminophen, albuterol, bisacodyl, HYDROcodone-acetaminophen, ipratropium, ondansetron **OR** ondansetron (ZOFRAN) IV, oxyCODONE, senna-docusate  Assessment/ Plan:  61 y.o. female with ESRD, diabetes, hypertension, anemia, TIA, who was admitted to [facility] on 08/25/2016 for evaluation of right hip pain after a fall.   [address], MWF, 3 hrs, [facility]  1.  End-stage renal disease HD MWF-  Pt seen and evaluated during HD, couldn't perform HD in a chair, won't be able to  discharge until dialysis can be tolerated in chair.   2.  Hypertension - continue amlodipine and losartan.   3.  Anemia of chronic kidney disease Hemoglobin 7.3, at last check, continue epogen.   4.  Hyperkalemia Follow up serum K today.       LOS: 8 Timonthy Hovater 3/12/201811:54 AM

## 2016-09-02 NOTE — Progress Notes (Signed)
Start of hd 

## 2016-09-02 NOTE — Progress Notes (Addendum)
Pt VSS. N&V early this Am, Zofran administered. Will monitor. O2 saturation at 97% rm air.

## 2016-09-02 NOTE — Progress Notes (Signed)
Post hd assessment 

## 2016-09-02 NOTE — Progress Notes (Signed)
Pre hd info 

## 2016-09-02 NOTE — Progress Notes (Signed)
Packed red blood cell transfusion started.

## 2016-09-02 NOTE — Progress Notes (Signed)
Pre hd assessment  

## 2016-09-02 NOTE — Progress Notes (Signed)
PT Cancellation Note  Patient Details Name: Vanessa Salazar MRN: [id number] DOB: 1956/03/05   Cancelled Treatment:    Reason Eval/Treat Not Completed: Patient at procedure or test/unavailable.  Pt currently in dialysis and not available for PT session.  Will re-attempt PT eval at a later date/time.  Hendricks LimesEmily Martice Doty, PT 09/02/16, 10:25 AM [phone]

## 2016-09-02 NOTE — Progress Notes (Signed)
After dialysis was completed notified MD. Discharge papers were placed. This RN notified Son and he stated that he was sending someone to pick up patient. Had interpreter go translate discharge instructions, including last dose of meds, scripts, and activity. Allowed time for questions. According to translator no questions were noted.  IV removed with cath intact.

## 2016-09-03 LAB — BPAM RBC
Blood Product Expiration Date: 201803292359
ISSUE DATE / TIME: 201803121105
Unit Type and Rh: 5100

## 2016-09-03 LAB — TYPE AND SCREEN
ABO/RH(D): O POS
Antibody Screen: NEGATIVE
UNIT DIVISION: 0

## 2016-09-03 NOTE — Discharge Summary (Signed)
[facility] [place] [facility], 61 y.o., DOB 09-Mar-1956, MRN [id number]. Admission date: 08/25/2016 Discharge Date 09/03/2016 Primary MD No PCP Per Patient Admitting Physician Tonye Royalty, DO  Admission Diagnosis  Fall, initial encounter [W19.XXXA] Closed nondisplaced fracture of pelvis, unspecified part of pelvis, initial encounter (HCC) [S32.9XXA]  Discharge Diagnosis   Active Problems:   Pelvic fracture (HCC)  End-stage renal disease Essential hypertension Diabetes type 2 Fever resolved       Hospital Course  Vanessa Salazar is a 61 y.o. female with a known history of DM, HTN, ESRD on HD, anemia, TIA was in a usual state of health until this evening when she sustained a mechanical fall landing on her right hip. Pt was noted to have non displaced pelvic fracture. She was admited to hospital for pain control. Pt continue to have pain slow to improve. Pt was recommend to go to SNF. However she didn't have the resources to go to a skilled nursing facility. Patient has home health arrangements as she is stable for discharge.          Consults  nephrology, orthopedics  Significant Tests:  See full reports for all details     Dg Chest 1 View  Result Date: 08/27/2016 CLINICAL DATA:  Fevers EXAM: CHEST 1 VIEW COMPARISON:  09/09/2011 FINDINGS: Cardiac shadow is enlarged. Vascular congestion is noted with findings suggestive of a right-sided pleural effusion. No focal confluent infiltrate is seen. No bony abnormality is noted. IMPRESSION: Changes consistent with CHF and right pleural effusion. No focal infiltrate is noted. Electronically Signed   By: Alcide Clever M.D.   On: 08/27/2016 14:50   Dg Hip Unilat W Or Wo Pelvis 2-3 Views Right  Result Date: 08/25/2016 CLINICAL DATA:  Right hip and inner thigh pain. Unable to bear weight after fall. EXAM: DG HIP (WITH OR WITHOUT PELVIS) 2-3V RIGHT COMPARISON:  None. FINDINGS: There is an acute  minimally displaced fracture of the right superior pubic ramus and parasymphysis. Fracture likely involves the inferior pubic ramus near the pubic symphysis given its subtle lucency. AVN of the right femoral head with flattening is noted. Slight joint space narrowing of both hips. The visualized lumbar spine is intact. 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 The iliac bones are maintained. Sclerotic foci of the left iliac bone and left femoral head are keeping with bone islands. IMPRESSION: 1. AVN of the right femoral head with slight flattening of the weight-bearing portion of the femoral head. 2. There is a right minimally displaced superior pubic ramus fracture with more subtle lucency suspicious for fracture at the junction of the inferior pubic ramus and pubic symphysis. Electronically Signed   By: Tollie Eth M.D.   On: 08/25/2016 21:13       Today   Subjective:   Vanessa Salazar  patient still has some pain. Will be receiving hemodialysis later today  Objective:   Blood pressure (!) 168/71, pulse 88, temperature 98.3 F (36.8 C), temperature source Oral, resp. rate 13, height 5\' 2"  (1.575 m), weight 127 lb 13.9 oz (58 kg), last menstrual period 06/24/2005, SpO2 96 %.  . No intake or output data in the 24 hours ending 09/03/16 1357  Exam VITAL SIGNS: Blood pressure (!) 168/71, pulse 88, temperature 98.3 F (36.8 C), temperature source Oral, resp. rate 13, height 5\' 2"  (1.575 m), weight 127 lb 13.9 oz (58 kg), last menstrual period 06/24/2005, SpO2 96 %.  GENERAL:  61 y.o.-year-old patient lying in the bed with no acute distress.  EYES: Pupils equal, round, reactive to light and accommodation. No scleral icterus. Extraocular muscles intact.  HEENT: Head atraumatic, normocephalic. Oropharynx and nasopharynx clear.  NECK:  Supple, no jugular venous distention. No thyroid enlargement, no tenderness.  LUNGS: Normal breath sounds bilaterally, no wheezing, rales,rhonchi or crepitation. No  use of accessory muscles of respiration.  CARDIOVASCULAR: S1, S2 normal. No murmurs, rubs, or gallops.  ABDOMEN: Soft, nontender, nondistended. Bowel sounds present. No organomegaly or mass.  EXTREMITIES: No pedal edema, cyanosis, or clubbing.  NEUROLOGIC: Cranial nerves II through XII are intact. Muscle strength 5/5 in all extremities. Sensation intact. Gait not checked.  PSYCHIATRIC: The patient is alert and oriented x 3.  SKIN: No obvious rash, lesion, or ulcer.   Data Review     CBC w Diff:  Lab Results  Component Value Date   WBC 2.8 (L) 09/01/2016   HGB 7.3 (L) 09/01/2016   HGB 6.2 (L) 09/06/2013   HCT 21.1 (L) 09/01/2016   PLT 125 (L) 09/01/2016   LYMPHOPCT 8 08/25/2016   MONOPCT 6 08/25/2016   EOSPCT 1 08/25/2016   BASOPCT 0 08/25/2016   CMP:  Lab Results  Component Value Date   NA 135 08/28/2016   K 5.2 (H) 08/28/2016   K 5.6 (H) 07/29/2012   CL 93 (L) 08/28/2016   CO2 28 08/28/2016   BUN 45 (H) 08/28/2016   CREATININE 6.62 (H) 08/28/2016   PROT 6.2 (L) 08/26/2016   ALBUMIN 3.7 08/26/2016   BILITOT 0.6 08/26/2016   ALKPHOS 115 08/26/2016   AST 20 08/26/2016   ALT 11 (L) 08/26/2016  .  Micro Results Recent Results (from the past 240 hour(s))  CULTURE, BLOOD (ROUTINE X 2) w Reflex to ID Panel     Status: None   Collection Time: 08/27/16  2:05 PM  Result Value Ref Range Status   Specimen Description BLOOD RIGHT FA  Final   Special Requests BOTTLES DRAWN AEROBIC AND ANAEROBIC BCAV  Final   Culture NO GROWTH 5 DAYS  Final   Report Status 09/01/2016 FINAL  Final  CULTURE, BLOOD (ROUTINE X 2) w Reflex to ID Panel     Status: None   Collection Time: 08/27/16  2:05 PM  Result Value Ref Range Status   Specimen Description BLOOD RIGHT HAND  Final   Special Requests BOTTLES DRAWN AEROBIC AND ANAEROBIC BCAV  Final   Culture NO GROWTH 5 DAYS  Final   Report Status 09/01/2016 FINAL  Final     Code Status History    Date Active Date Inactive Code Status Order  ID Comments User Context   08/26/2016 12:55 AM 09/02/2016  7:29 PM Full Code [id number]  Tonye Royalty, DO ED          Follow-up Information    MILLER,HOWARD E, MD. Schedule an appointment as soon as possible for a visit in 2 day(s).   Specialty:  Specialist Contact information: [address] [place] [address] [phone]        [facility] Follow up in 1 week(s).   Specialty:  General Practice Contact information: [address] [place] [address] [phone]           Discharge Medications   Allergies as of 09/02/2016   No Known Allergies     Medication List    TAKE these medications   acetaminophen 325 MG tablet Commonly known as:  TYLENOL Take 650  mg by mouth every 6 (six) hours as needed.   amLODipine 10 MG tablet Commonly known as:  NORVASC Take 10 mg by mouth daily.   atorvastatin 20 MG tablet Commonly known as:  LIPITOR Take 20 mg by mouth every evening.   cinacalcet 30 MG tablet Commonly known as:  SENSIPAR Take 30 mg by mouth daily.   furosemide 40 MG tablet Commonly known as:  LASIX Take 80 mg by mouth daily.   hydrALAZINE 100 MG tablet Commonly known as:  APRESOLINE Take 100 mg by mouth 2 (two) times daily.   HYDROcodone-acetaminophen 7.5-325 MG tablet Commonly known as:  NORCO Take 1 tablet by mouth every 8 (eight) hours as needed for moderate pain.   ibuprofen 400 MG tablet Commonly known as:  ADVIL,MOTRIN Take 1 tablet (400 mg total) by mouth every 6 (six) hours as needed.   insulin NPH-regular Human (70-30) 100 UNIT/ML injection Commonly known as:  NOVOLIN 70/30 Inject 8-10 Units into the skin 2 (two) times daily.   lidocaine 5 % Commonly known as:  LIDODERM Place 1 patch onto the skin daily. Remove & Discard patch within 12 hours or as directed by MD   methocarbamol 500 MG tablet Commonly known as:  ROBAXIN Take 1 tablet (500 mg total) by mouth every 8 (eight) hours as needed  for muscle spasms.   OMEPRAZOLE PO Take 1 capsule by mouth daily.   oxyCODONE 10 mg 12 hr tablet Commonly known as:  OXYCONTIN Take 1 tablet (10 mg total) by mouth every 12 (twelve) hours.   sevelamer 800 MG tablet Commonly known as:  RENAGEL Take 1,600 mg by mouth 4 (four) times daily as needed.          Total Time in preparing paper work, data evaluation and todays exam - 35 minutes  Auburn BilberryPATEL, Danilynn Jemison M.D on 09/03/2016 at 1:57 PM  [facility]   Office  [phone]

## 2016-10-13 ENCOUNTER — Emergency Department: Payer: Self-pay

## 2016-10-13 ENCOUNTER — Emergency Department
Admission: EM | Admit: 2016-10-13 | Discharge: 2016-10-13 | Disposition: A | Discharge status: Home / Self Care | Payer: Self-pay | Attending: Emergency Medicine | Admitting: Emergency Medicine

## 2016-10-13 ENCOUNTER — Encounter: Payer: Self-pay | Admitting: Emergency Medicine

## 2016-10-13 DIAGNOSIS — Z794 Long term (current) use of insulin: Secondary | ICD-10-CM | POA: Insufficient documentation

## 2016-10-13 DIAGNOSIS — N186 End stage renal disease: Secondary | ICD-10-CM | POA: Insufficient documentation

## 2016-10-13 DIAGNOSIS — I739 Peripheral vascular disease, unspecified: Secondary | ICD-10-CM | POA: Insufficient documentation

## 2016-10-13 DIAGNOSIS — Z79899 Other long term (current) drug therapy: Secondary | ICD-10-CM | POA: Insufficient documentation

## 2016-10-13 DIAGNOSIS — I120 Hypertensive chronic kidney disease with stage 5 chronic kidney disease or end stage renal disease: Secondary | ICD-10-CM | POA: Insufficient documentation

## 2016-10-13 DIAGNOSIS — M79671 Pain in right foot: Secondary | ICD-10-CM

## 2016-10-13 DIAGNOSIS — E1122 Type 2 diabetes mellitus with diabetic chronic kidney disease: Secondary | ICD-10-CM | POA: Insufficient documentation

## 2016-10-13 DIAGNOSIS — Z992 Dependence on renal dialysis: Secondary | ICD-10-CM | POA: Insufficient documentation

## 2016-10-13 DIAGNOSIS — M79672 Pain in left foot: Secondary | ICD-10-CM

## 2016-10-13 NOTE — ED Provider Notes (Signed)
[facility]-EMERGENCY DEPARTMENT Provider Note   CSN: [id number] Arrival date & time: 10/13/16  4098     History   Chief Complaint Chief Complaint  Patient presents with  . Foot Pain    HPI Vanessa Salazar is a 61 y.o. female presents to the emergency department for evaluation of left greater than right foot pain. She denies any recent trauma or injury. Patient states pain is along bilateral calcanei and was present Monday morning when she first stood up. She's had pain for the last 6 days that has been moderate. She's been taking Tylenol with no improvement. She is able to ambulate with a walker. Patient did have a recent pelvic fracture but denies any injury to her feet at this time. She has been walking and ambulatory since her pelvic fracture occurred. Patient has a history of diabetes and peripheral or vascular disease. She denies any leg pain, swelling, chest pain, shortness of breath. Patient describes the pain in both feet as numbness and sharp and only increased with weightbearing activity. She gets relief with elevation.  HPI  Past Medical History:  Diagnosis Date  . Diabetes mellitus without complication (HCC)   . ESRD on hemodialysis (HCC) 2007  . Hypertension     Patient Active Problem List   Diagnosis Date Noted  . Pelvic fracture (HCC) 08/25/2016    Past Surgical History:  Procedure Laterality Date  . CESAREAN SECTION     X3  . EYE SURGERY      OB History    Obstetric Comments   n/a       Home Medications    Prior to Admission medications   Medication Sig Start Date End Date Taking? Authorizing Provider  acetaminophen (TYLENOL) 325 MG tablet Take 650 mg by mouth every 6 (six) hours as needed.    Historical Provider, MD  amLODipine (NORVASC) 10 MG tablet Take 10 mg by mouth daily.    Historical Provider, MD  atorvastatin (LIPITOR) 20 MG tablet Take 20 mg by mouth every evening.    Historical Provider, MD  cinacalcet (SENSIPAR) 30 MG tablet Take 30 mg  by mouth daily. 05/21/16   Historical Provider, MD  furosemide (LASIX) 40 MG tablet Take 80 mg by mouth daily. 05/10/16   Historical Provider, MD  hydrALAZINE (APRESOLINE) 100 MG tablet Take 100 mg by mouth 2 (two) times daily. 08/30/15   Historical Provider, MD  HYDROcodone-acetaminophen (NORCO) 7.5-325 MG tablet Take 1 tablet by mouth every 8 (eight) hours as needed for moderate pain. 09/02/16   Auburn Bilberry, MD  ibuprofen (ADVIL,MOTRIN) 400 MG tablet Take 1 tablet (400 mg total) by mouth every 6 (six) hours as needed. 09/02/16   Auburn Bilberry, MD  insulin NPH-regular Human (NOVOLIN 70/30) (70-30) 100 UNIT/ML injection Inject 8-10 Units into the skin 2 (two) times daily. 05/01/12   Historical Provider, MD  lidocaine (LIDODERM) 5 % Place 1 patch onto the skin daily. Remove & Discard patch within 12 hours or as directed by MD 09/02/16   Auburn Bilberry, MD  methocarbamol (ROBAXIN) 500 MG tablet Take 1 tablet (500 mg total) by mouth every 8 (eight) hours as needed for muscle spasms. 09/02/16   Auburn Bilberry, MD  OMEPRAZOLE PO Take 1 capsule by mouth daily.    Historical Provider, MD  oxyCODONE (OXYCONTIN) 10 mg 12 hr tablet Take 1 tablet (10 mg total) by mouth every 12 (twelve) hours. 09/02/16   Auburn Bilberry, MD  sevelamer (RENAGEL) 800 MG tablet Take 1,600 mg by  mouth 4 (four) times daily as needed.    Historical Provider, MD    Family History Family History  Problem Relation Age of Onset  . Diabetes Father     Social History Social History  Substance Use Topics  . Smoking status: Never Smoker  . Smokeless tobacco: Never Used  . Alcohol use No     Allergies   Patient has no known allergies.   Review of Systems Review of Systems  Constitutional: Negative for activity change, chills, fatigue and fever.  HENT: Negative for congestion, sinus pressure and sore throat.   Eyes: Negative for visual disturbance.  Respiratory: Negative for cough, chest tightness and shortness of breath.     Cardiovascular: Negative for chest pain and leg swelling.  Gastrointestinal: Negative for abdominal pain, diarrhea, nausea and vomiting.  Genitourinary: Negative for dysuria.  Musculoskeletal: Positive for arthralgias. Negative for gait problem, joint swelling and myalgias.  Skin: Negative for rash and wound.  Neurological: Positive for numbness (normal bilateral lower legs). Negative for weakness and headaches.  Hematological: Negative for adenopathy.  Psychiatric/Behavioral: Negative for agitation, behavioral problems and confusion.     Physical Exam Updated Vital Signs BP (!) 155/66   Pulse 96   Temp 99 F (37.2 C) (Oral)   Resp 12   Ht  (1.575 m)   Wt 55 kg   LMP 06/24/2005 (LMP Unknown) Comment: Pt reports it was in the same year she began Dialysis  SpO2 96%   BMI 22.18 kg/m   Physical Exam  Constitutional: She is oriented to person, place, and time. She appears well-developed and well-nourished. No distress.  HENT:  Head: Normocephalic and atraumatic.  Mouth/Throat: Oropharynx is clear and moist.  Eyes: EOM are normal. Pupils are equal, round, and reactive to light. Right eye exhibits no discharge. Left eye exhibits no discharge.  Neck: Normal range of motion. Neck supple.  Cardiovascular: Normal rate, regular rhythm and intact distal pulses.   Pulmonary/Chest: Effort normal and breath sounds normal. No respiratory distress. She exhibits no tenderness.  Abdominal: Soft. She exhibits no distension. There is no tenderness.  Musculoskeletal: Normal range of motion. She exhibits no edema.  Patient is tender along bilateral calcanei, normal ankle plantarflexion dorsiflexion. Sensation is intact bilateral lower extremities. Examination of bilateral lower extremity shows patient has no swelling edema warmth or redness. She has weakened bilateral pulses along the dorsalis pedis and posterior tibialis. Both are heard and symmetric with Doppler. She has no ulcerations bilateral  feet. Skin color is normal. There is normal cap refill bilaterally.  Neurological: She is alert and oriented to person, place, and time. She has normal reflexes.  Skin: Skin is warm and dry.  Psychiatric: She has a normal mood and affect. Her behavior is normal. Thought content normal.     ED Treatments / Results  Labs (all labs ordered are listed, but only abnormal results are displayed) Labs Reviewed - No data to display  EKG  EKG Interpretation None       Radiology Dg Foot Complete Left  Result Date: 10/13/2016 CLINICAL DATA:  Pain on the plantar surface of both feet for 7 days since walking. No known injury. Initial encounter. EXAM: LEFT FOOT - COMPLETE 3+ VIEW COMPARISON:  None. FINDINGS: There is no evidence of fracture or dislocation. There is no evidence of arthropathy or other focal bone abnormality. Atherosclerotic calcifications are seen. IMPRESSION: No acute abnormality or evidence arthropathy. Extensive atherosclerosis. Electronically Signed   By: Drusilla Kanner M.D.  On: 10/13/2016 10:56   Dg Foot Complete Right  Result Date: 10/13/2016 CLINICAL DATA:  Pain on the plantar surface of both feet for 7 days since walking. No known injury. Initial encounter. EXAM: RIGHT FOOT COMPLETE - 3+ VIEW COMPARISON:  None. FINDINGS: There is no evidence of acute fracture or dislocation. Remote healed distal tibial and fibular fractures are seen with fixation hardware in place. There is no evidence of arthropathy or other focal bone abnormality. Extensive atherosclerosis is noted. IMPRESSION: No acute abnormality. Healed distal tibial and fibular fractures with fixation hardware in place. Extensive atherosclerosis. Electronically Signed   By: Drusilla Kanner M.D.   On: 10/13/2016 10:56    Procedures Procedures (including critical care time)  Medications Ordered in ED Medications - No data to display   Initial Impression / Assessment and Plan / ED Course  I have reviewed the  triage vital signs and the nursing notes.  Pertinent labs & imaging results that were available during my care of the patient were reviewed by me and considered in my medical decision making (see chart for details).     61 year old female with end-stage renal disease, diabetes mellitus. She complains of bilateral feet pain along the calcanei. No trauma or injury. X-rays negative addition significant vascular changes. She has pain with standing and weightbearing activity, relieved with elevation. Arterial pulses are present with Doppler. Patient will follow up with pain in vascular. Continue Tylenol as stated for pain. She is educated on signs and symptoms return to the ED for. She'll use a walker to help with ambulation.  Final Clinical Impressions(s) / ED Diagnoses   Final diagnoses:  Bilateral foot pain  PVD (peripheral vascular disease) [facility])    New Prescriptions Discharge Medication List as of 10/13/2016  1:12 PM       Evon Slack, PA-C 10/13/16 1340    Phineas Semen, MD 10/14/16 [phone]

## 2016-10-13 NOTE — Discharge Instructions (Signed)
Please follow-up with pain and vascular doctor. Call tomorrow to schedule follow-up appointment. Return to the ER for any increasing pain worsening symptoms or urgent changes in her health.

## 2016-10-13 NOTE — ED Notes (Addendum)
See triage note  States she developed pain to sole of right foot last Monday  No redness or swelling noted denies injury  Pulses sl decreased on right

## 2016-10-13 NOTE — ED Triage Notes (Signed)
Pt c/o bilateral foot pin while walking at sole of feet. Denies injury. Pain since Monday. Interpretor used.

## 2016-12-09 ENCOUNTER — Ambulatory Visit: Payer: Self-pay

## 2017-01-13 ENCOUNTER — Other Ambulatory Visit (INDEPENDENT_AMBULATORY_CARE_PROVIDER_SITE_OTHER): Payer: Self-pay | Admitting: Family Medicine

## 2017-01-13 DIAGNOSIS — R600 Localized edema: Secondary | ICD-10-CM

## 2017-01-15 ENCOUNTER — Ambulatory Visit (INDEPENDENT_AMBULATORY_CARE_PROVIDER_SITE_OTHER): Payer: Self-pay

## 2017-01-15 DIAGNOSIS — R600 Localized edema: Secondary | ICD-10-CM

## 2017-01-27 ENCOUNTER — Ambulatory Visit: Payer: Self-pay

## 2017-01-29 ENCOUNTER — Ambulatory Visit
Admission: RE | Admit: 2017-01-29 | Discharge: 2017-01-29 | Disposition: A | Discharge status: Home / Self Care | Payer: Self-pay | Source: Ambulatory Visit | Attending: Oncology | Admitting: Oncology

## 2017-01-29 ENCOUNTER — Ambulatory Visit: Payer: Self-pay | Attending: Oncology | Admitting: *Deleted

## 2017-01-29 VITALS — BP 140/70 | HR 78 | Temp 98.1°F | Ht 62.0 in | Wt 123.0 lb

## 2017-01-29 DIAGNOSIS — R920 Mammographic microcalcification found on diagnostic imaging of breast: Secondary | ICD-10-CM

## 2017-01-29 NOTE — Patient Instructions (Signed)
Gave patient hand-out, Women Staying Healthy, Active and Well from [facility], with education on breast health, pap smears, heart and colon health. 

## 2017-01-29 NOTE — Progress Notes (Signed)
Subjective:     Patient ID: Vanessa Salazar, female   DOB: 1955-09-24, 61 y.o.   MRN: [id number]  HPI   Review of Systems     Objective:   Physical Exam  Pulmonary/Chest: Right breast exhibits no inverted nipple, no mass, no nipple discharge, no skin change and no tenderness. Left breast exhibits no inverted nipple, no mass, no nipple discharge, no skin change and no tenderness. Breasts are symmetrical.         Assessment:     61 year [facility] Hispanic [facility]  Jacqui, the interpreter present during the interview and exam.  Patient is unable to ambulate very well due to a painful,swollen right foot.  States she has had x-rays, but continues to have problems.  Bilateral edema noted, with 2+ edema at the right ankle.  Had to have the patient escorted to the breast center via wheelchair.  She was encouraged to follow-up with primary care for further evaluation.  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  Palpable thrill noted.  Clinical breast exam with right breast thickening at 12:00.  Patient states this is normal for her.  Last exam revealed thickening at the 10:00 area.  Could be positional changes.  Last mammogram was a birads 3 for calcs.  Taught self breast awareness.  Last pap on 12/06/13 was negative / negative.  Next pap will be due in 2015.  Patient has been screened for eligibility.  She does not have any insurance, Medicare or Medicaid.  She also meets financial eligibility.  Hand-out given on the Affordable Care Act.     Plan:     Bilateral diagnostic mammogram and ultrasound ordered.  Will follow-up per [facility] protocol.

## 2017-01-30 ENCOUNTER — Encounter: Payer: Self-pay | Admitting: *Deleted

## 2017-01-30 NOTE — Progress Notes (Signed)
Letter mailed from the [facility] to inform patient of her normal mammogram results.  Patient is to follow-up with annual screening in one year.  [facility] to Christy. 

## 2017-03-20 ENCOUNTER — Other Ambulatory Visit: Payer: Self-pay | Admitting: Orthopedic Surgery

## 2017-03-20 DIAGNOSIS — M2392 Unspecified internal derangement of left knee: Secondary | ICD-10-CM

## 2017-03-20 DIAGNOSIS — M25462 Effusion, left knee: Secondary | ICD-10-CM

## 2017-03-26 ENCOUNTER — Encounter: Payer: Self-pay | Admitting: Family Medicine

## 2017-03-26 ENCOUNTER — Ambulatory Visit: Payer: Self-pay

## 2017-04-02 ENCOUNTER — Ambulatory Visit
Admission: RE | Admit: 2017-04-02 | Discharge: 2017-04-02 | Disposition: A | Discharge status: Home / Self Care | Payer: Self-pay | Source: Ambulatory Visit | Attending: Orthopedic Surgery | Admitting: Orthopedic Surgery

## 2017-04-02 DIAGNOSIS — M2392 Unspecified internal derangement of left knee: Secondary | ICD-10-CM | POA: Insufficient documentation

## 2017-04-02 DIAGNOSIS — M25462 Effusion, left knee: Secondary | ICD-10-CM | POA: Insufficient documentation

## 2017-04-02 DIAGNOSIS — R937 Abnormal findings on diagnostic imaging of other parts of musculoskeletal system: Secondary | ICD-10-CM | POA: Insufficient documentation

## 2018-02-11 ENCOUNTER — Ambulatory Visit: Payer: Self-pay | Attending: Oncology

## 2018-02-11 ENCOUNTER — Ambulatory Visit
Admission: RE | Admit: 2018-02-11 | Discharge: 2018-02-11 | Disposition: A | Discharge status: Home / Self Care | Payer: Self-pay | Source: Ambulatory Visit | Attending: Oncology | Admitting: Oncology

## 2018-02-11 ENCOUNTER — Encounter (INDEPENDENT_AMBULATORY_CARE_PROVIDER_SITE_OTHER): Payer: Self-pay

## 2018-02-11 VITALS — BP 153/73 | HR 79 | Temp 98.0°F | Ht 63.0 in | Wt 126.0 lb

## 2018-02-11 DIAGNOSIS — Z0000 Encounter for general adult medical examination without abnormal findings: Secondary | ICD-10-CM | POA: Insufficient documentation

## 2018-02-11 NOTE — Progress Notes (Signed)
  Subjective:     Patient ID: Vanessa Salazar, female   DOB: 04/08/1956, 62 y.o.   MRN: [id number]  HPI   Review of Systems     Objective:   Physical Exam  Pulmonary/Chest: Right breast exhibits no inverted nipple, no mass, no nipple discharge, no skin change and no tenderness. Left breast exhibits no inverted nipple, no mass, no nipple discharge, no skin change and no tenderness. Breasts are symmetrical.  Scar from prvious portacath upper right chest         Assessment:     62 year old [facility] Delos HaringLoyda Murr interpreted exam. Patient screened, and meets [facility] eligibility.  Patient does not have insurance, Medicare or Medicaid.  Handout given on Affordable Care Act.  Instructed patient on breast self awareness using teach back method.  Clinical breast exam unremarkable.  No mass or lump palpated.  Patient has a cast on right foot and ankle and left wrist from a fall at home in March 2019.  States she will be able to stand for mammogram.  Also has left forearm access for dialysis.    Risk Assessment    Risk Scores      02/11/2018   Last edited by: Jim LikeLambert, Sheena M, RN   5-year risk: 0.7 %   Lifetime risk: 4.3 %             Plan:     Sent for bilateral screening mammogram.

## 2018-02-18 NOTE — Progress Notes (Signed)
Letter mailed from [facility] to notify of normal mammogram results.  Patient to return in one year for annual screening.  Copy to HSIS. 

## 2019-03-26 ENCOUNTER — Telehealth: Payer: Self-pay

## 2019-03-26 NOTE — Telephone Encounter (Signed)
Pre-visit screening call was attempted prior to [facility] appointment on 03/30/2019. Spanish Interpreter: Auda # [id number]. No Answer / No voicemail was left

## 2019-03-29 ENCOUNTER — Other Ambulatory Visit: Payer: Self-pay

## 2019-03-30 ENCOUNTER — Ambulatory Visit
Admission: RE | Admit: 2019-03-30 | Discharge: 2019-03-30 | Disposition: A | Discharge status: Home / Self Care | Payer: Self-pay | Source: Ambulatory Visit | Attending: Oncology | Admitting: Oncology

## 2019-03-30 ENCOUNTER — Ambulatory Visit: Payer: Self-pay | Attending: Oncology

## 2019-03-30 ENCOUNTER — Other Ambulatory Visit: Payer: Self-pay

## 2019-03-30 VITALS — BP 173/61 | HR 69 | Temp 96.7°F | Ht 62.0 in | Wt 123.0 lb

## 2019-03-30 DIAGNOSIS — Z0000 Encounter for general adult medical examination without abnormal findings: Secondary | ICD-10-CM

## 2019-03-30 NOTE — Progress Notes (Signed)
  Subjective:     Patient ID: Vanessa Salazar, female   DOB: 08/25/1955, 64 y.o.   MRN: [id number]  HPI   Review of Systems     Objective:   Physical Exam Chest:     Breasts:        Right: No swelling, bleeding, inverted nipple, mass, nipple discharge, skin change or tenderness.        Left: No swelling, bleeding, inverted nipple, mass, nipple discharge, skin change or tenderness.  Genitourinary:    Labia:        Right: No rash, tenderness, lesion or injury.        Left: No rash, tenderness, lesion or injury.      Vagina: No signs of injury and foreign body. No vaginal discharge, erythema, tenderness, bleeding, lesions or prolapsed vaginal walls.     Cervix: No cervical motion tenderness, discharge, friability, lesion, erythema, cervical bleeding or eversion.        Assessment:     64 year old hispanic female returns for annual [facility] screening, pap.  Patient has right leg swelling, and needs assistance getting on exam table. Continues to get dialysis.   Patient screened, and meets [facility] eligibility.  Patient does not have insurance, Medicare or Medicaid. Instructed patient on breast self awareness using teach back method.  Clinical breast exam unremarkable.  No mass or lump palpated.  Pelvic exam normal.  Some discomfort duriong exam related to vaginal dryness.    Plan:     Sent for bilateral screening mammogram.   Specimen collected for pap.

## 2019-04-04 NOTE — Progress Notes (Signed)
Letter mailed from [facility] to notify of normal mammogram results.  Patient to return in one year for annual screening. NegativePap results mailed to patient.

## 2019-04-08 LAB — PAP LB AND HPV HIGH-RISK: HPV, high-risk: NEGATIVE

## 2019-09-29 ENCOUNTER — Other Ambulatory Visit
Admission: RE | Admit: 2019-09-29 | Discharge: 2019-09-29 | Disposition: A | Discharge status: Home / Self Care | Payer: Self-pay | Source: Other Acute Inpatient Hospital | Attending: Nephrology | Admitting: Nephrology

## 2019-09-29 LAB — POTASSIUM: Potassium: 3.1 mmol/L — ABNORMAL LOW (ref 3.5–5.1)

## 2020-11-21 ENCOUNTER — Ambulatory Visit: Payer: Self-pay | Attending: Oncology

## 2020-11-21 VITALS — BP 174/67 | Temp 96.4°F | Ht 62.0 in | Wt 111.0 lb

## 2020-11-21 DIAGNOSIS — N63 Unspecified lump in unspecified breast: Secondary | ICD-10-CM

## 2020-11-21 NOTE — Progress Notes (Signed)
  Subjective:     Patient ID: Vanessa Salazar, female   DOB: [birth year], 65 y.o.   MRN: [id number]  HPI   Review of Systems     Objective:   Physical Exam Chest:       Comments: 2 cm right breast lump        Assessment:     65 year old patient returns for annual [facility] exam.  Carley Hammed from [facility] interpreted exam.  States fell last year and has lump on right breast. Patient screened, and meets [facility] eligibility.  Patient does not have insurance, Medicare or Medicaid. Instructed patient on breast self awareness using teach back method .  Palpated 2 cm. retroareolar Right breast mass. 12 o'clock .   Risk Assessment    Risk Scores      11/21/2020 03/30/2019   Last edited by: Jim Like, RN Jim Like, RN   5-year risk: 0.8 % 0.8 %   Lifetime risk: 3.9 % 4.2 %            Plan:     Scheduled for diagnostic mammogram, and ulltrasound.

## 2020-11-28 ENCOUNTER — Other Ambulatory Visit: Payer: Self-pay

## 2020-11-28 ENCOUNTER — Inpatient Hospital Stay: Admission: RE | Admit: 2020-11-28 | Payer: Self-pay | Source: Ambulatory Visit

## 2020-12-04 ENCOUNTER — Ambulatory Visit
Admission: RE | Admit: 2020-12-04 | Discharge: 2020-12-04 | Disposition: A | Discharge status: Home / Self Care | Payer: Self-pay | Source: Ambulatory Visit | Attending: Oncology | Admitting: Oncology

## 2020-12-04 ENCOUNTER — Other Ambulatory Visit: Payer: Self-pay

## 2020-12-04 DIAGNOSIS — N63 Unspecified lump in unspecified breast: Secondary | ICD-10-CM

## 2020-12-05 NOTE — Progress Notes (Signed)
Letter mailed from [facility] to notify of normal mammogram results.  Patient to return in one year for annual screening.  Copy to HSIS. 

## 2022-02-20 ENCOUNTER — Other Ambulatory Visit: Payer: Self-pay

## 2022-02-20 DIAGNOSIS — Z1231 Encounter for screening mammogram for malignant neoplasm of breast: Secondary | ICD-10-CM

## 2022-02-26 ENCOUNTER — Ambulatory Visit
Admission: RE | Admit: 2022-02-26 | Discharge: 2022-02-26 | Disposition: A | Discharge status: Home / Self Care | Payer: Self-pay | Source: Ambulatory Visit | Attending: Obstetrics and Gynecology | Admitting: Obstetrics and Gynecology

## 2022-02-26 ENCOUNTER — Ambulatory Visit: Payer: Self-pay | Attending: Hematology and Oncology | Admitting: *Deleted

## 2022-02-26 VITALS — BP 185/71 | Wt 108.7 lb

## 2022-02-26 DIAGNOSIS — Z1231 Encounter for screening mammogram for malignant neoplasm of breast: Secondary | ICD-10-CM

## 2022-02-26 DIAGNOSIS — Z01419 Encounter for gynecological examination (general) (routine) without abnormal findings: Secondary | ICD-10-CM

## 2022-02-26 NOTE — Progress Notes (Signed)
Ms. Vanessa Salazar is a 66 y.o. female who presents to [facility] clinic today with no complaints.   Patient returns to [facility] for annual clinical breast exam and mammogram.    Pap Smear: Pap not smear completed today. Last Pap smear was 03/30/19 at [facility] clinic and was  negative /negative . Per patient has no history of an abnormal Pap smear. Last Pap smear result is available in Epic.   Physical exam: Breasts Patient is currently on dialysis and is very weak.  Breast exam completed in the wheelchair.  Breasts symmetrical. No skin abnormalities bilateral breasts. No nipple retraction bilateral breasts. No nipple discharge bilateral breasts. No lymphadenopathy. No lumps palpated bilateral breasts.       Pelvic/Bimanual Pap is not indicated today    Smoking History: Patient has never smoked    Patient Navigation: Patient education provided. Access to services provided for patient through [facility] program. Claretha Cooper the  interpreter provided interpretation. No transportation provided   Colorectal Cancer Screening: Per patient she has had a colonoscopy 1.5 years ago at [facility]. No complaints today.    Breast and Cervical Cancer Risk Assessment: Patient does not have family history of breast cancer, known genetic mutations, or radiation treatment to the chest before age 19. Patient does not have history of cervical dysplasia, immunocompromised, or DES exposure in-utero.  Risk Assessment     Risk Scores       02/26/2022 11/21/2020   Last edited by: Narda Rutherford, LPN Jim Like, RN   5-year risk: 0.8 % 0.8 %   Lifetime risk: 3.8 % 3.9 %            A: BCCCP exam without pap smear   P: Referred patient to the [facility] for a screening mammogram. Appointment scheduled today.  Will follow up per BCCCP protocol.  Jim Like, RN 02/26/2022 8:36 AM

## 2022-11-03 IMAGING — MG DIGITAL DIAGNOSTIC BILAT W/ TOMO W/ CAD
6 of 10 series · 6 of 30 positions shown · non-contrast
Comparison: Previous exam(s).

CLINICAL DATA: 64-year-old female presenting for evaluation of a
palpable lump in the right breast, which occurred following a fall
with injury to the right breast where she hit the breast on her
walker in Thursday January, 2020. The patient feels the area has been stable
since she first noticed at a year ago.

EXAM:
DIGITAL DIAGNOSTIC BILATERAL MAMMOGRAM WITH TOMOSYNTHESIS AND CAD;
ULTRASOUND RIGHT BREAST LIMITED
TECHNIQUE: Bilateral digital diagnostic mammography and breast tomosynthesis
was performed. The images were evaluated with computer-aided
detection.; Targeted ultrasound examination of the right breast was
performed

[L CC synth-2D]
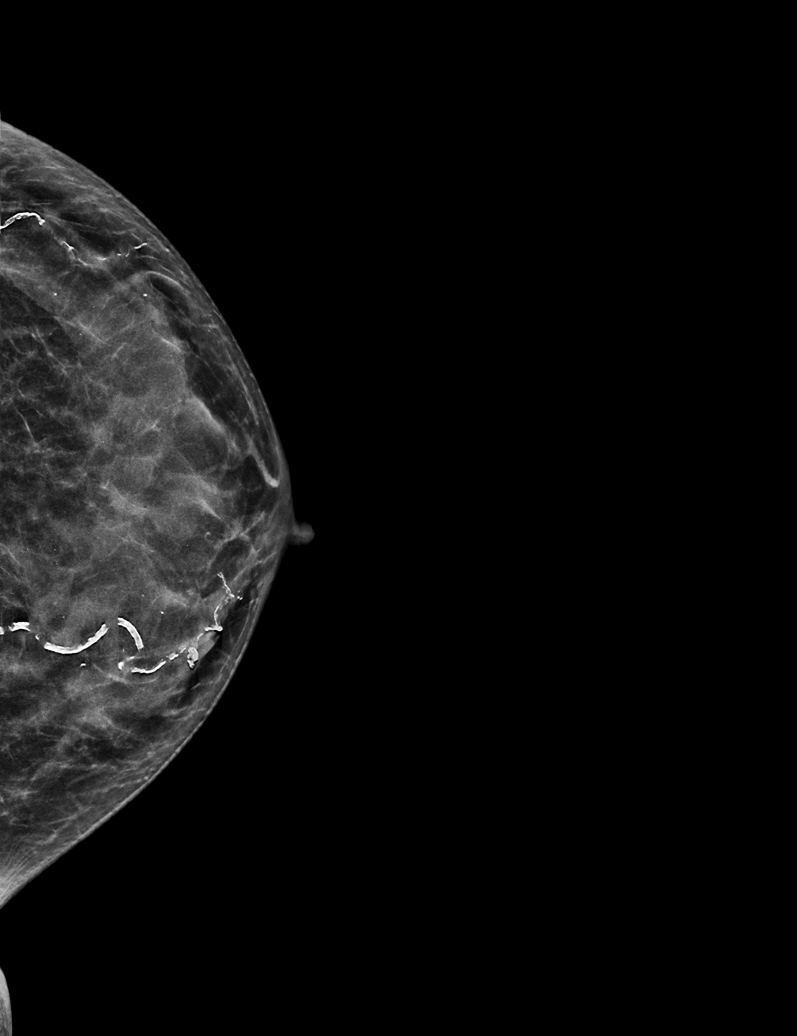

[R MLO synth-2D]
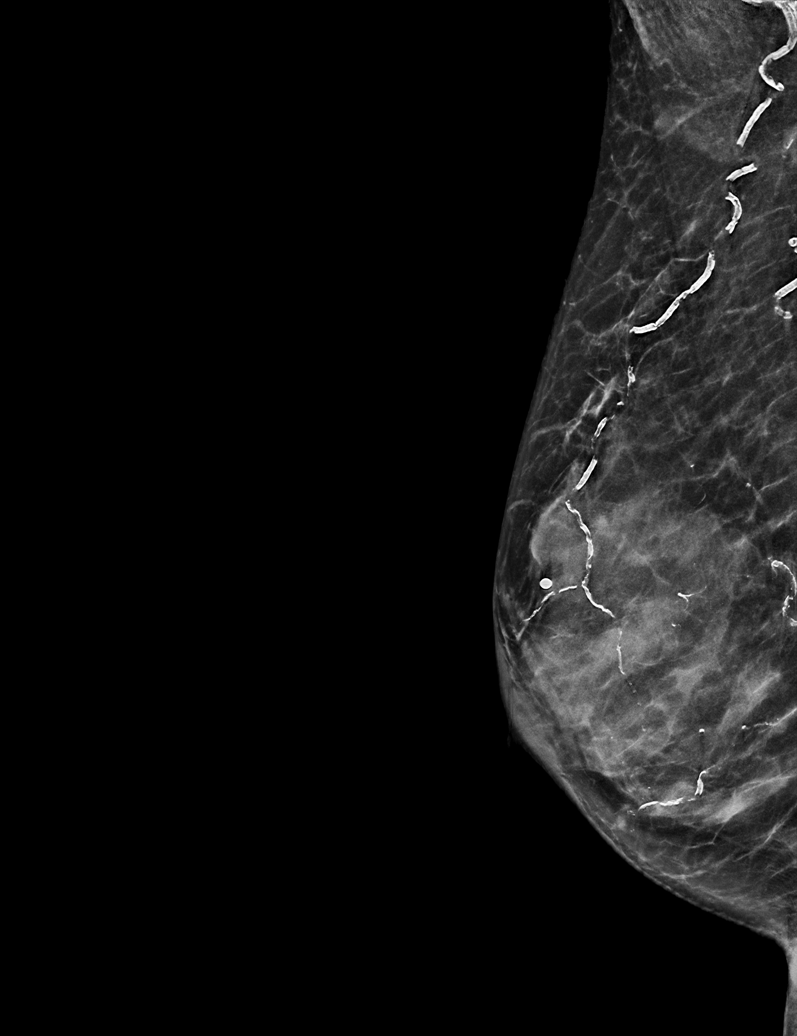

[L MLO synth-2D]
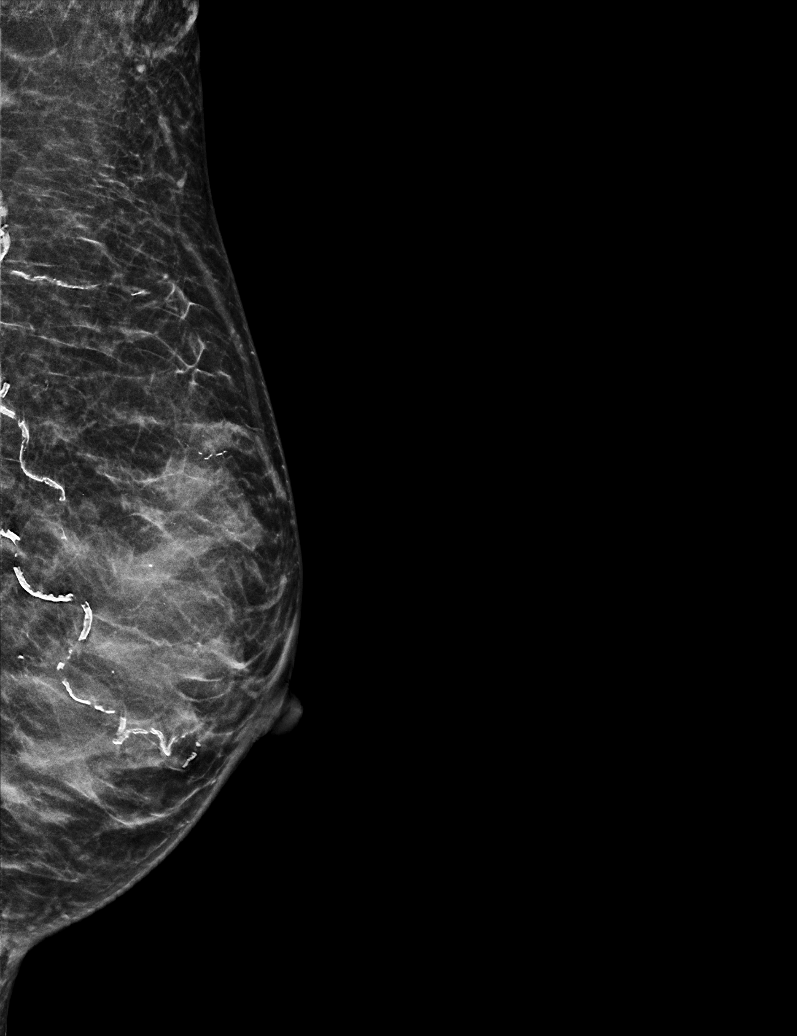

[R TAN synth-2D]
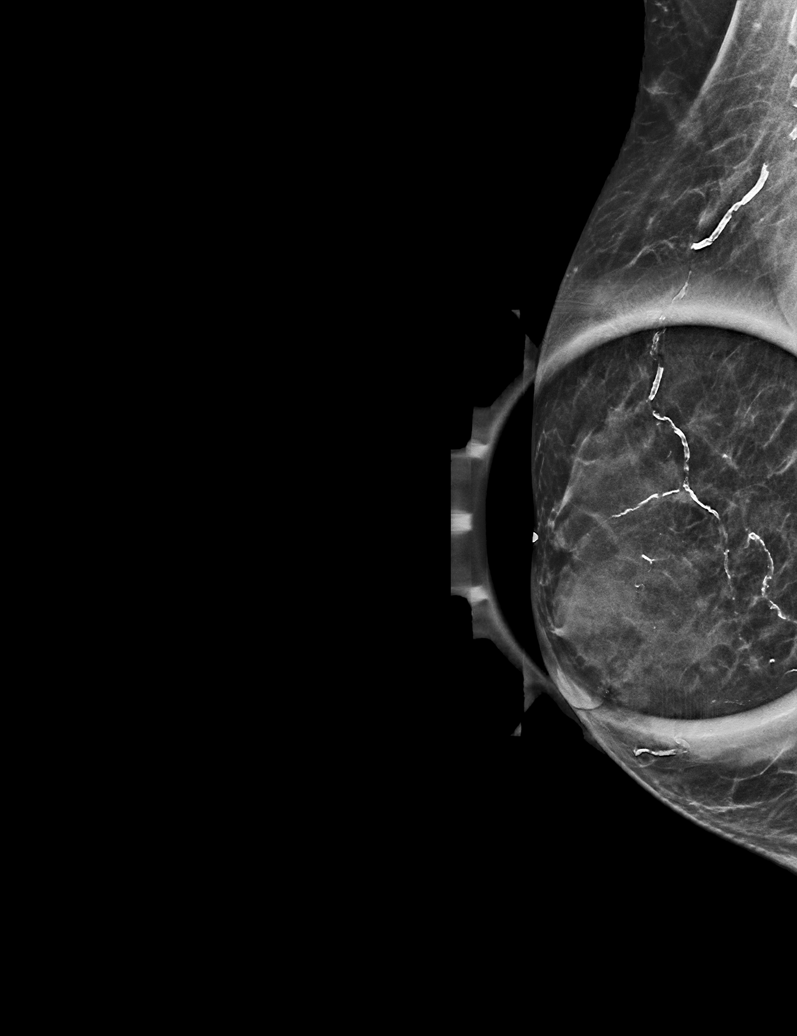

[R CC synth-2D]
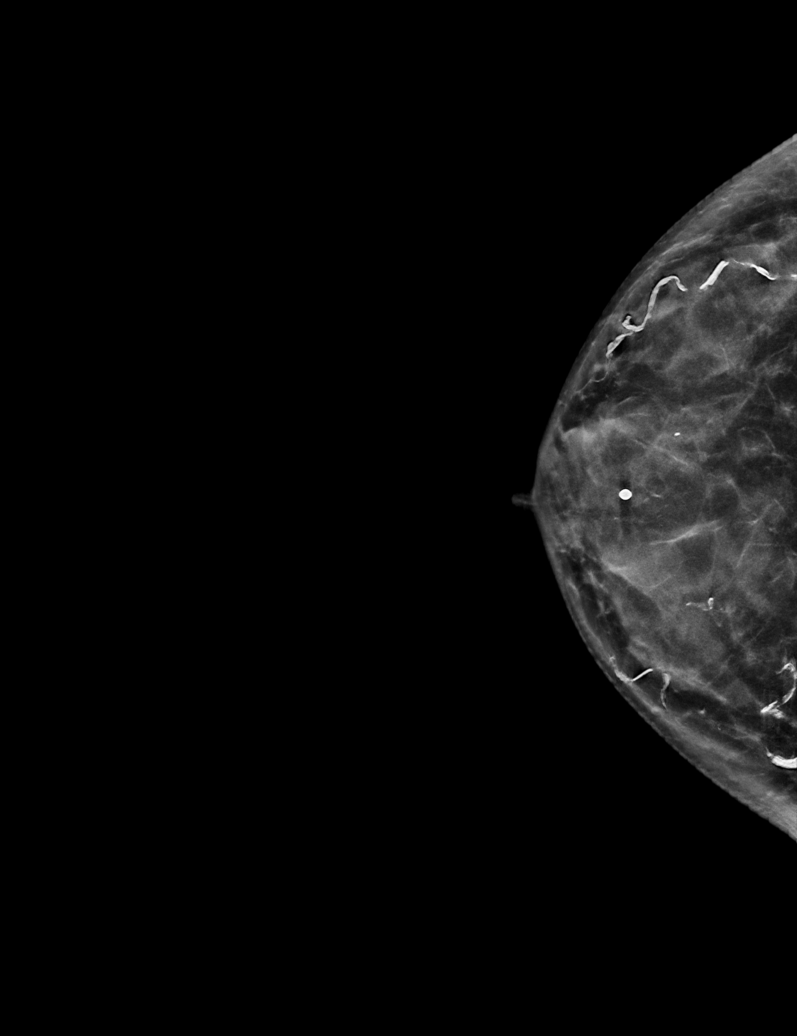

[L MLO tomo · tomo slice 17/34.0]
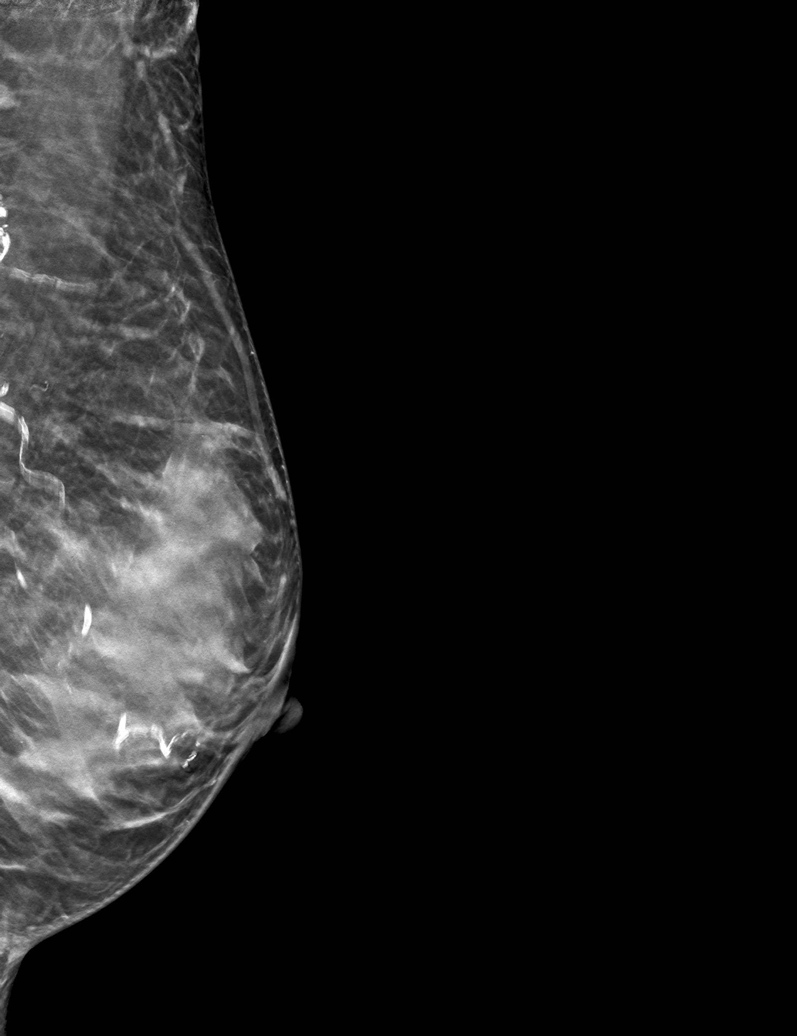

[6 of 30 positions shown; findings below may reference images not displayed]

ACR Breast Density Category d: The breast tissue is extremely dense,
which lowers the sensitivity of mammography.
FINDINGS: A BB has been placed at the palpable site of concern along the
superior aspect of the right breast. The tissue deep to this marker
is extremely dense. No definite mass is identified, but could be
obscured within the dense tissue. No other suspicious
calcifications, masses or areas of distortion are seen in the
bilateral breasts.

Physical exam of the right breast demonstrates a firm ridge of
tissue in the upper inner quadrant.

Ultrasound targeted to the palpable area in the superior to upper
inner right breast demonstrates normal dense fibroglandular tissue.
No suspicious masses or areas of shadowing are identified.
IMPRESSION: 1. There are no suspicious mammographic or targeted sonographic
abnormalities at the palpable site of concern in the upper inner
right breast.

2.  No evidence of right axillary lymphadenopathy.

3.  No evidence of left breast malignancy.

RECOMMENDATION:
1. Clinical follow-up recommended for the palpable area of concern
in the right breast. Any further workup should be based on clinical
grounds.

2.  Screening mammogram in one year.(Code:N5-V-7RY)

I have discussed the findings and recommendations with the patient.
If applicable, a reminder letter will be sent to the patient
regarding the next appointment.

BI-RADS CATEGORY  1: Negative.

## 2022-11-03 IMAGING — US US BREAST*R* LIMITED INC AXILLA
1 series · 4 of 4 positions shown · non-contrast
Comparison: Previous exam(s).

CLINICAL DATA: 64-year-old female presenting for evaluation of a
palpable lump in the right breast, which occurred following a fall
with injury to the right breast where she hit the breast on her
walker in Thursday January, 2020. The patient feels the area has been stable
since she first noticed at a year ago.

EXAM:
DIGITAL DIAGNOSTIC BILATERAL MAMMOGRAM WITH TOMOSYNTHESIS AND CAD;
ULTRASOUND RIGHT BREAST LIMITED
TECHNIQUE: Bilateral digital diagnostic mammography and breast tomosynthesis
was performed. The images were evaluated with computer-aided
detection.; Targeted ultrasound examination of the right breast was
performed

[Series 1: us breast*right* limited inc axilla · 0.06mm/px · 4 of 4 slices shown]
[im 1/4]
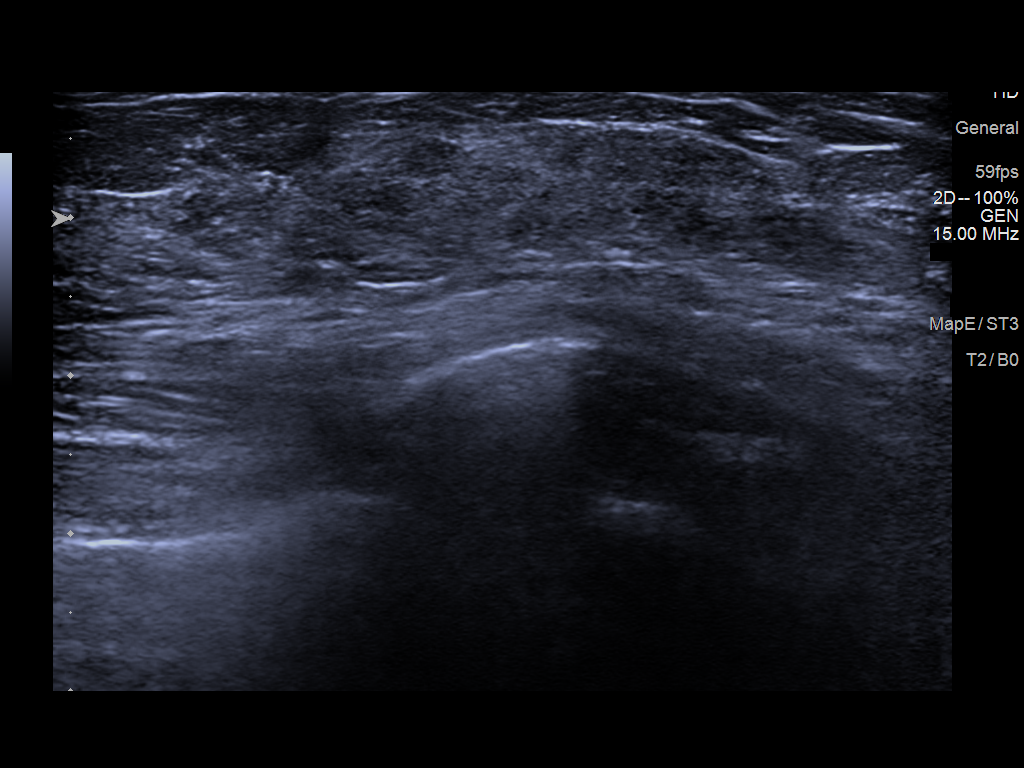
[im 2/4]
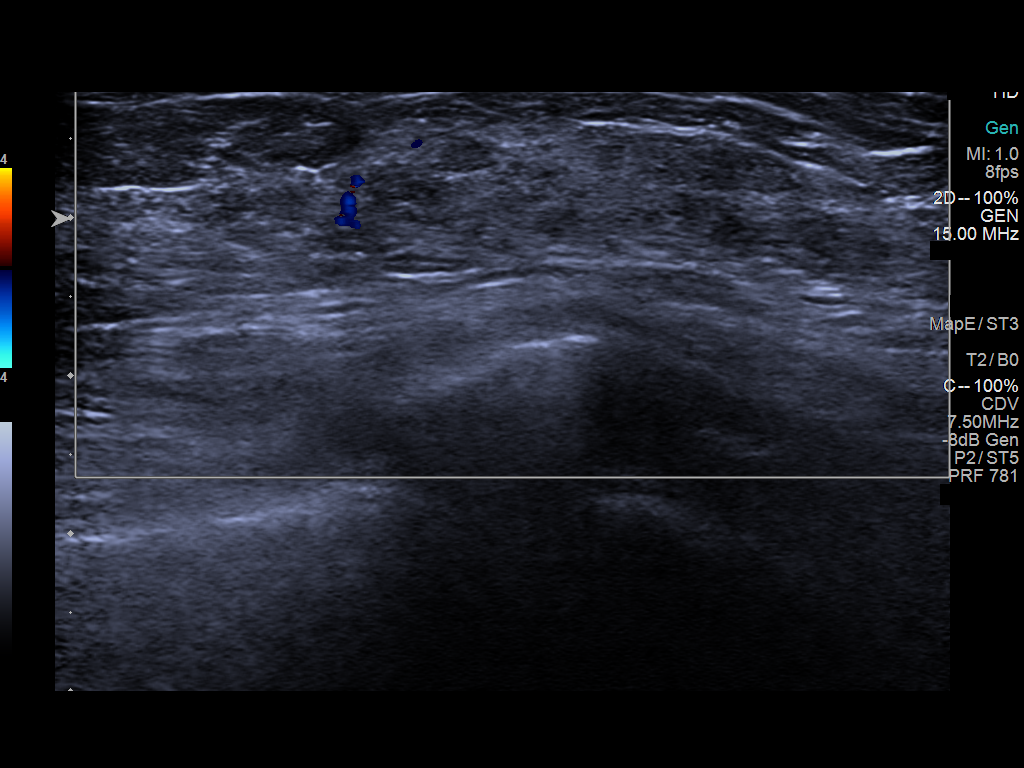
[im 3/4]
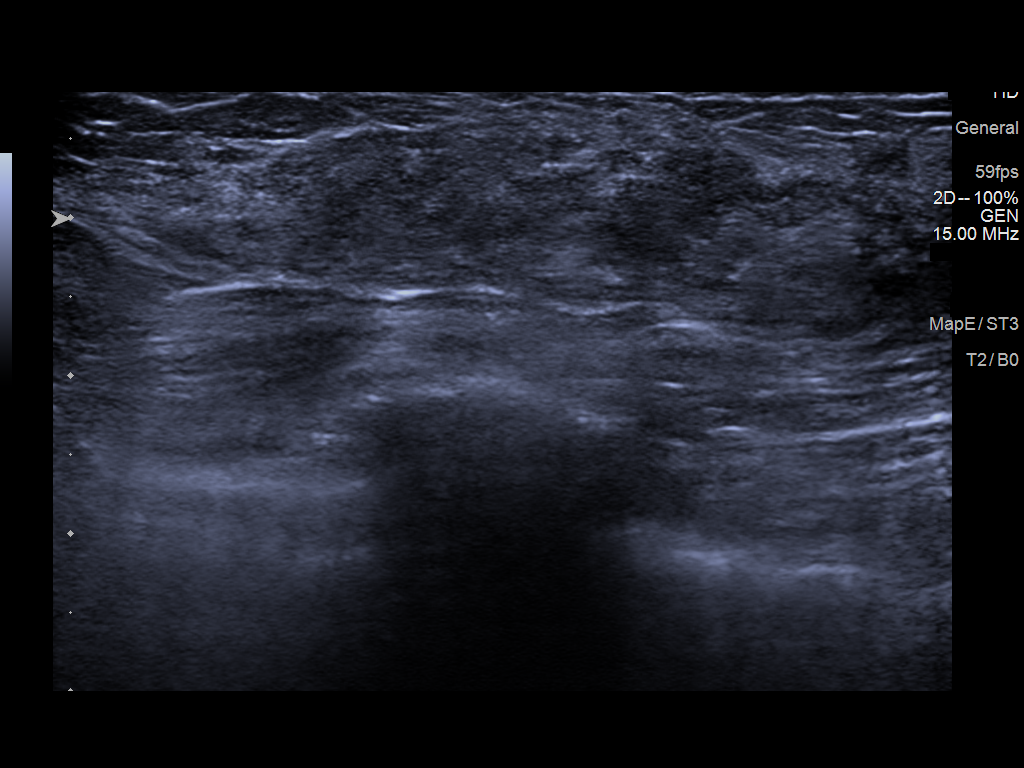
[im 4/4]
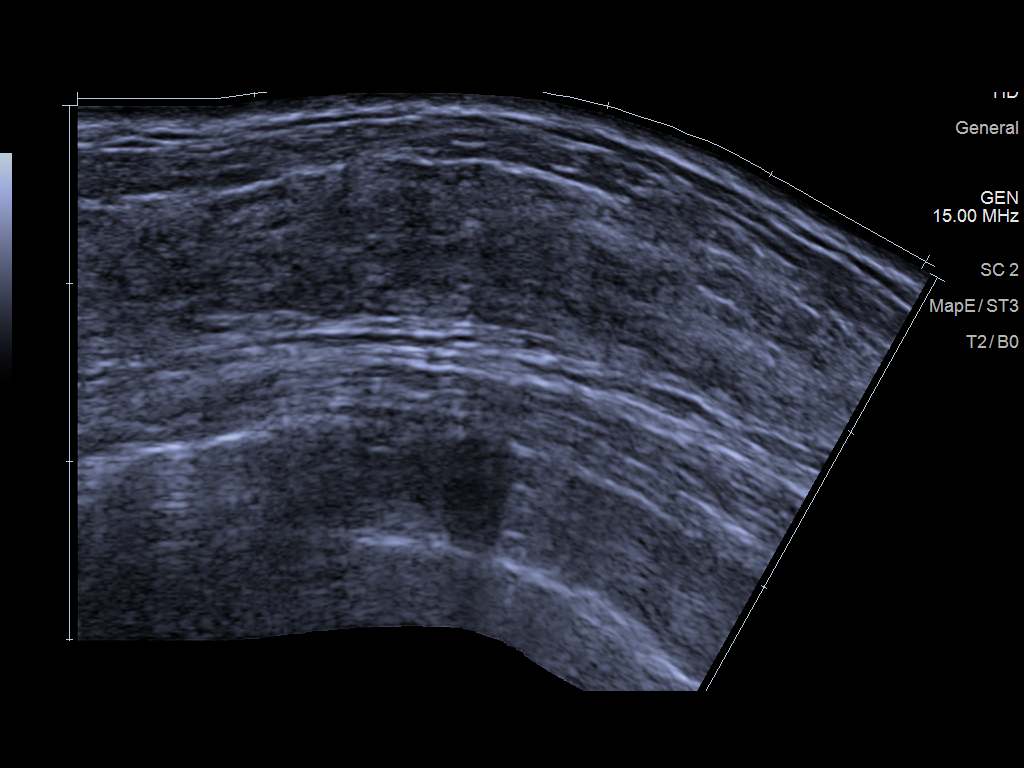

[4 of 4 positions shown; findings below may reference images not displayed]

ACR Breast Density Category d: The breast tissue is extremely dense,
which lowers the sensitivity of mammography.
FINDINGS: A BB has been placed at the palpable site of concern along the
superior aspect of the right breast. The tissue deep to this marker
is extremely dense. No definite mass is identified, but could be
obscured within the dense tissue. No other suspicious
calcifications, masses or areas of distortion are seen in the
bilateral breasts.

Physical exam of the right breast demonstrates a firm ridge of
tissue in the upper inner quadrant.

Ultrasound targeted to the palpable area in the superior to upper
inner right breast demonstrates normal dense fibroglandular tissue.
No suspicious masses or areas of shadowing are identified.
IMPRESSION: 1. There are no suspicious mammographic or targeted sonographic
abnormalities at the palpable site of concern in the upper inner
right breast.

2.  No evidence of right axillary lymphadenopathy.

3.  No evidence of left breast malignancy.

RECOMMENDATION:
1. Clinical follow-up recommended for the palpable area of concern
in the right breast. Any further workup should be based on clinical
grounds.

2.  Screening mammogram in one year.(Code:N5-V-7RY)

I have discussed the findings and recommendations with the patient.
If applicable, a reminder letter will be sent to the patient
regarding the next appointment.

BI-RADS CATEGORY  1: Negative.

## 2023-03-25 ENCOUNTER — Other Ambulatory Visit: Payer: Self-pay

## 2023-03-25 DIAGNOSIS — Z1231 Encounter for screening mammogram for malignant neoplasm of breast: Secondary | ICD-10-CM

## 2023-04-14 ENCOUNTER — Ambulatory Visit: Payer: Self-pay

## 2023-04-14 ENCOUNTER — Ambulatory Visit: Payer: Self-pay | Attending: Obstetrics and Gynecology

## 2023-04-14 ENCOUNTER — Telehealth: Payer: Self-pay | Admitting: Hematology and Oncology

## 2023-04-25 ENCOUNTER — Telehealth: Payer: Self-pay | Admitting: Family Medicine

## 2023-05-13 ENCOUNTER — Other Ambulatory Visit: Payer: Self-pay | Admitting: Family Medicine

## 2023-05-13 DIAGNOSIS — Z1231 Encounter for screening mammogram for malignant neoplasm of breast: Secondary | ICD-10-CM

## 2023-06-04 ENCOUNTER — Ambulatory Visit
Admission: RE | Admit: 2023-06-04 | Discharge: 2023-06-04 | Disposition: A | Discharge status: Home / Self Care | Payer: Self-pay | Source: Ambulatory Visit | Attending: Family Medicine | Admitting: Family Medicine

## 2023-06-04 DIAGNOSIS — Z1231 Encounter for screening mammogram for malignant neoplasm of breast: Secondary | ICD-10-CM | POA: Insufficient documentation

## 2023-10-08 NOTE — ED Provider Notes (Signed)
 Received sign out from previous provider.  Patient Summary: Vanessa Salazar is a 68 y.o. female with a past medical history of HTN, TIA, T2DM, and ESRD on hemodialysis via AV fistula (MWF), who presents from dialysis after they were unable to access her left forearm fistula today. Her last full dialysis session was on Monday 4/14. Prior to sign out, PVL of the hemodialysis graft revealed thrombus in the fistula outflow vein at the mid-forearm. Basic labs unremarkable. Coags within normal limits. Action List:  Pending VIR recommendations.  Updates ED Course as of 10/08/23 1812  Wed Oct 08, 2023  1552 I spoke to VIR regarding the patient.  They recommended a heparin  drip, admission, and they will do the procedure in the morning.  I have updated the patient on this.  1620 VIR reach back out to me, there has been a change in plan.  Patient will not require a heparin  drip at this time and instead will be scheduled for an outpatient procedure to have this clot removed tomorrow.  I discussed in length with the patient that she should be n.p.o. at midnight, she will get a call from the scheduler in the morning in order to schedule the procedure for the same day in the afternoon.  I also discussed with the patient that this means she will not receive dialysis today, on review of her labs they are stable, with a potassium of 4.8.  Her EKG is without acute changes otherwise.  We did discuss however that if the patient were to develop any chest pain, shortness of breath, or any concerning symptoms, she should return to the emergency department in order to have her workup rechecked to see if she will need earlier dialysis.  Otherwise she has no acute complaints or concerns at this time and feels comfortable with the plan for discharge with plan for occlusion removal tomorrow.  We discussed in length once again strict return precautions and she was discharged in stable conditions.  Patient did not receive heparin .     Documentation assistance was provided by Almarie Meager, Scribe on October 08, 2023 at 4:21 PM for Malinda Gales, MD.  Documentation assistance was provided by the scribe in my presence.  The documentation recorded by the scribe has been reviewed by me and accurately reflects the services I personally performed.     Note has been documented by Almarie Meager on 10/08/2023

## 2023-12-30 NOTE — H&P (Signed)
-------------------------------------------------------------------------------   Attestation signed by Aundria Marsa Messier, MD at 01/10/24 1013 I saw the patient with resident and agree with the resident's history, exam, assessment and plan.   [facility] MD -------------------------------------------------------------------------------  Brief Pre-operative History & Physical  Patient name: Vanessa Salazar CSN: [id number] MRN: [id number] Admit Date: 12/30/2023 Date of Surgery: 12/30/2023 Performing Service: Transplant   Code Status: Full Code   Assessment/Plan:   Vanessa Salazar is a 68 y.o. female with ESRD (end stage renal disease)  [N18.6] Problem with vascular access [Z78.9], who presents for: Procedure(s) (LRB): REVISION, ARTERIOVENOUS FISTULA W/ THROMBECTOMY, AUTOGENOUS OR NONAUTOGENOUS DIALYSIS GRAFT (SEP. PROC) (Left).   Consent obtained in office is accurate. Risks, benefits, and alternatives to surgery were reviewed, and all questions were answered.  Proceed to the OR as planned.     History of Present Illness:  Vanessa Salazar is a 68 y.o. female with ESRD (end stage renal disease)  [N18.6] Problem with vascular access [Z78.9]. She was recently seen in clinic, where a detailed HPI can be found. She was noted to benefit from: Procedure(s) (LRB): REVISION, ARTERIOVENOUS FISTULA W/ THROMBECTOMY, AUTOGENOUS OR NONAUTOGENOUS DIALYSIS GRAFT (SEP. PROC) (Left).    Allergies Patient has no known allergies.  Medications   Current Medications[1]  Vital Signs BP 142/74   Pulse 59   Temp 36 C (96.8 F) (Temporal)   Resp 20   Wt 53.6 kg (118 lb 2.7 oz)   SpO2 98%   BMI 21.34 kg/m  Facility age limit for growth %iles is 20 years. Facility age limit for growth %iles is 20 years..   Physical Exam General: Well developed, appears stated age, in no acute distress Mental status: Alert and oriented x3 Cardiovascular: Normal Pulmonary: Symmetric chest rise, unlabored  breathing Relevant System for Surgery: Patient was marked  Labs and Studies: Lab Results  Component Value Date   WBC 6.8 10/08/2023   HGB 14.1 10/08/2023   HCT 43.2 10/08/2023   PLT 103 (L) 10/08/2023    Lab Results  Component Value Date   PT 11.9 10/08/2023   INR 1.04 10/08/2023   APTT 27.4 10/08/2023          [1] Current Facility-Administered Medications  Medication Dose Route Frequency Provider Last Rate Last Admin  . ceFAZolin (ANCEF) IVPB 2 g in 50 ml dextrose  (premix)  2 g Intravenous For OR use [place], Marsa Messier, MD      . lactated Ringers infusion  10 mL/hr Intravenous Continuous [place], Marsa Messier, MD       Facility-Administered Medications Ordered in Other Encounters  Medication Dose Route Frequency Provider Last Rate Last Admin  . ropivacaine (NAROPIN) 5 mg/mL (0.5 %) injection   Perineural PRN one-step-med Vernice Copier B, MD   25 mL at 12/30/23 1515

## 2024-01-12 NOTE — Progress Notes (Signed)
I reviewed the note by the resident and agree with the plan based on the documented assessment. I did not see this patient. Larrissa Stivers, MD, MS, FACS

## 2024-01-12 NOTE — Progress Notes (Signed)
 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 -- Disc Heme History:  -- Impression: Mixed mechanism glaucoma including steroid response, OU - difficulty with reliable imaging, no view OD - IOP by tonopen 17/14 today, Tapp unreliable  -- Plan: - Continue max topical therapy  - continue PFAT hourly to q2h - RTC 2 mo v/tapp  # HX of Anterior uveitis OD #Steroid Responder both eyes #Dry eye Ou - Presented 03/07/23 to [facility] ED for right eye pain and blurry vision, has had multiple bouts of uveitis per last notes and irregular use of drops - Workup:  HLA-B27: POSITIVE  TB: negative  RPR: nonreactive  ACE: negative - today VA stable at CF, IOP improved at 17  Update 10/22/23: Stopped steroid 3/26. IOP slighlty elevaed compared to prior. No signs of acute uveitis flare. Has not been using the gel or erythromycin ointment with the PFATs. Reports no change in symptoms. Using AT q1-1.5h (based on conversation not using PFATs. Emphasized importance of preservative free for frequent use). -Exam concerning for impending neurotrophic ulcer inf right eye.  11/26/23 IOP 19 OU. Exam consistent with likely glaucoma drops induced ocular surface reaction. 2+ injection, diffuse 4+ PEEs OS, RTBUT. Therefore, will continue  PFAT q1h and switch glaucoma drops to preservative free equivalent. 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   # Pseudophakia both eyes  stable monitor  Discussed with Dr. Candise

## 2024-02-05 NOTE — Progress Notes (Signed)
 Nurse, children's used for triaging. PI#[id number].  Patient unable to verify medication.

## 2024-02-05 NOTE — Progress Notes (Signed)
 Proximalized left RC AVF ready to use, is patent and matured Begin cannulation protocol  BP 140/59   Pulse 60   Temp 36.4 C (97.5 F) (Tympanic)   Ht 158.5 cm (5' 2.4)   Wt 53.4 kg (117 lb 12.8 oz)   BMI 21.27 kg/m

## 2024-03-15 NOTE — Progress Notes (Signed)
 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

## 2024-03-17 NOTE — Progress Notes (Addendum)
-------------------------------------------------------------------------------   Attestation signed by Ranjan, Sinthu Sivarama, MD at 03/17/24 1601 I was present with resident during the history and exam.  I discussed the findings, assessment, and plan with the resident and agree with the findings and plan as documented in the resident's note.  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, both eyes - corneal edema OD new onset 02/2024 - CCT 612  OD 520 OS - VA is unchanged, still CF, poor vision potential given severity of glaucoma and photorecptor drop out - do not recommend EK - Start Valtrex - pt on dialysis; so dose adjusted to 500mg  daily x 2 weeks to see if this improves - Start Muro TID OD - Continue PFAT q1-2 hours   Return in 3 weeks v/t/k check   # HX of Anterior uveitis OD #Steroid Responder both eyes - Presented 03/07/23 to [facility] ED for right eye pain and blurry vision, has had multiple bouts of uveitis per last notes and irregular use of drops - Workup:  HLA-B27: POSITIVE  TB: negative  RPR: nonreactive  ACE: negative - today VA stable at CF; cornea OD with irregular epi, endo shagreen like pattern, decreased sensation, haze -- could be 2/2 herpetic etiology  Plan: - Continue PFAT q1-2 hours    ------------------------  #Steroid response glaucoma, OU #Mixed mechanism glaucoma, OU -- Age: 68 y.o. 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

## 2024-03-21 ENCOUNTER — Other Ambulatory Visit: Payer: Self-pay

## 2024-03-21 ENCOUNTER — Ambulatory Visit: Payer: Self-pay | Admitting: Ophthalmology

## 2024-03-21 ENCOUNTER — Emergency Department: Payer: Self-pay

## 2024-03-21 ENCOUNTER — Observation Stay
Admission: EM | Admit: 2024-03-21 | Discharge: 2024-03-23 | Disposition: A | Discharge status: Home / Self Care | Payer: Self-pay | Attending: Internal Medicine | Admitting: Internal Medicine

## 2024-03-21 DIAGNOSIS — E1122 Type 2 diabetes mellitus with diabetic chronic kidney disease: Secondary | ICD-10-CM | POA: Insufficient documentation

## 2024-03-21 DIAGNOSIS — R42 Dizziness and giddiness: Secondary | ICD-10-CM

## 2024-03-21 DIAGNOSIS — Z79899 Other long term (current) drug therapy: Secondary | ICD-10-CM | POA: Insufficient documentation

## 2024-03-21 DIAGNOSIS — D72819 Decreased white blood cell count, unspecified: Secondary | ICD-10-CM | POA: Insufficient documentation

## 2024-03-21 DIAGNOSIS — Z23 Encounter for immunization: Secondary | ICD-10-CM | POA: Insufficient documentation

## 2024-03-21 DIAGNOSIS — D72818 Other decreased white blood cell count: Secondary | ICD-10-CM | POA: Insufficient documentation

## 2024-03-21 DIAGNOSIS — Z992 Dependence on renal dialysis: Secondary | ICD-10-CM | POA: Insufficient documentation

## 2024-03-21 DIAGNOSIS — R946 Abnormal results of thyroid function studies: Secondary | ICD-10-CM | POA: Insufficient documentation

## 2024-03-21 DIAGNOSIS — H409 Unspecified glaucoma: Secondary | ICD-10-CM | POA: Insufficient documentation

## 2024-03-21 DIAGNOSIS — E1165 Type 2 diabetes mellitus with hyperglycemia: Secondary | ICD-10-CM | POA: Insufficient documentation

## 2024-03-21 DIAGNOSIS — D638 Anemia in other chronic diseases classified elsewhere: Secondary | ICD-10-CM | POA: Insufficient documentation

## 2024-03-21 DIAGNOSIS — R4182 Altered mental status, unspecified: Principal | ICD-10-CM | POA: Insufficient documentation

## 2024-03-21 DIAGNOSIS — I10 Essential (primary) hypertension: Secondary | ICD-10-CM | POA: Insufficient documentation

## 2024-03-21 DIAGNOSIS — T50905A Adverse effect of unspecified drugs, medicaments and biological substances, initial encounter: Secondary | ICD-10-CM

## 2024-03-21 DIAGNOSIS — Z794 Long term (current) use of insulin: Secondary | ICD-10-CM | POA: Insufficient documentation

## 2024-03-21 DIAGNOSIS — D696 Thrombocytopenia, unspecified: Secondary | ICD-10-CM | POA: Insufficient documentation

## 2024-03-21 DIAGNOSIS — R410 Disorientation, unspecified: Principal | ICD-10-CM

## 2024-03-21 DIAGNOSIS — R7989 Other specified abnormal findings of blood chemistry: Secondary | ICD-10-CM | POA: Insufficient documentation

## 2024-03-21 DIAGNOSIS — I120 Hypertensive chronic kidney disease with stage 5 chronic kidney disease or end stage renal disease: Secondary | ICD-10-CM | POA: Insufficient documentation

## 2024-03-21 DIAGNOSIS — D631 Anemia in chronic kidney disease: Secondary | ICD-10-CM | POA: Insufficient documentation

## 2024-03-21 DIAGNOSIS — G929 Unspecified toxic encephalopathy: Secondary | ICD-10-CM | POA: Insufficient documentation

## 2024-03-21 DIAGNOSIS — E782 Mixed hyperlipidemia: Secondary | ICD-10-CM | POA: Insufficient documentation

## 2024-03-21 DIAGNOSIS — N186 End stage renal disease: Secondary | ICD-10-CM | POA: Insufficient documentation

## 2024-03-21 DIAGNOSIS — T375X5A Adverse effect of antiviral drugs, initial encounter: Secondary | ICD-10-CM | POA: Insufficient documentation

## 2024-03-21 LAB — DIFFERENTIAL
Abs Immature Granulocytes: 0 K/uL (ref 0.00–0.07)
Basophils Absolute: 0 K/uL (ref 0.0–0.1)
Basophils Relative: 1 %
Eosinophils Absolute: 0.1 K/uL (ref 0.0–0.5)
Eosinophils Relative: 2 %
Immature Granulocytes: 0 %
Lymphocytes Relative: 17 %
Lymphs Abs: 0.7 K/uL (ref 0.7–4.0)
Monocytes Absolute: 0.3 K/uL (ref 0.1–1.0)
Monocytes Relative: 9 %
Neutro Abs: 2.8 K/uL (ref 1.7–7.7)
Neutrophils Relative %: 71 %

## 2024-03-21 LAB — COMPREHENSIVE METABOLIC PANEL WITH GFR
ALT: 8 U/L (ref 0–44)
AST: 26 U/L (ref 15–41)
Albumin: 4 g/dL (ref 3.5–5.0)
Alkaline Phosphatase: 156 U/L — ABNORMAL HIGH (ref 38–126)
Anion gap: 15 (ref 5–15)
BUN: 32 mg/dL — ABNORMAL HIGH (ref 8–23)
CO2: 25 mmol/L (ref 22–32)
Calcium: 9.8 mg/dL (ref 8.9–10.3)
Chloride: 94 mmol/L — ABNORMAL LOW (ref 98–111)
Creatinine, Ser: 5.76 mg/dL — ABNORMAL HIGH (ref 0.44–1.00)
GFR, Estimated: 8 mL/min — ABNORMAL LOW (ref >60)
Glucose, Bld: 208 mg/dL — ABNORMAL HIGH (ref 70–99)
Potassium: 4.8 mmol/L (ref 3.5–5.1)
Sodium: 134 mmol/L — ABNORMAL LOW (ref 135–145)
Total Bilirubin: 1.3 mg/dL — ABNORMAL HIGH (ref 0.0–1.2)
Total Protein: 7.1 g/dL (ref 6.5–8.1)

## 2024-03-21 LAB — CBC
HCT: 32.1 % — ABNORMAL LOW (ref 36.0–46.0)
Hemoglobin: 10.5 g/dL — ABNORMAL LOW (ref 12.0–15.0)
MCH: 32 pg (ref 26.0–34.0)
MCHC: 32.7 g/dL (ref 30.0–36.0)
MCV: 97.9 fL (ref 80.0–100.0)
Platelets: 132 K/uL — ABNORMAL LOW (ref 150–400)
RBC: 3.28 MIL/uL — ABNORMAL LOW (ref 3.87–5.11)
RDW: 13.7 % (ref 11.5–15.5)
WBC: 3.9 K/uL — ABNORMAL LOW (ref 4.0–10.5)
nRBC: 0 % (ref 0.0–0.2)

## 2024-03-21 LAB — PROTIME-INR
INR: 1.1 (ref 0.8–1.2)
Prothrombin Time: 15.2 s (ref 11.4–15.2)

## 2024-03-21 LAB — GLUCOSE, CAPILLARY: Glucose-Capillary: 219 mg/dL — ABNORMAL HIGH (ref 70–99)

## 2024-03-21 LAB — TSH: TSH: 7.592 u[IU]/mL — ABNORMAL HIGH (ref 0.350–4.500)

## 2024-03-21 LAB — MRSA NEXT GEN BY PCR, NASAL: MRSA by PCR Next Gen: NOT DETECTED

## 2024-03-21 LAB — CBG MONITORING, ED: Glucose-Capillary: 205 mg/dL — ABNORMAL HIGH (ref 70–99)

## 2024-03-21 LAB — ETHANOL: Alcohol, Ethyl (B): <15 mg/dL (ref <15)

## 2024-03-21 LAB — APTT: aPTT: 30 s (ref 24–36)

## 2024-03-21 MED ORDER — ACETAMINOPHEN 325 MG PO TABS: 650.0000 mg | ORAL_TABLET | Freq: Four times a day (QID) | ORAL | Status: DC | PRN

## 2024-03-21 MED ORDER — INSULIN ASPART 100 UNIT/ML IJ SOLN
0.0000 [IU] | Freq: Three times a day (TID) | INTRAMUSCULAR | Status: DC
  Administered 2024-03-22: 1 [IU] via SUBCUTANEOUS
  Filled 2024-03-21: qty 1

## 2024-03-21 MED ORDER — ACETAMINOPHEN 650 MG RE SUPP: 650.0000 mg | Freq: Four times a day (QID) | RECTAL | Status: DC | PRN

## 2024-03-21 MED ORDER — HYDRALAZINE HCL 50 MG PO TABS
100.0000 mg | ORAL_TABLET | Freq: Two times a day (BID) | ORAL | Status: DC
Start: 2024-03-21 — End: 2024-03-23
  Administered 2024-03-21 – 2024-03-23 (×3): 100 mg via ORAL
  Filled 2024-03-21 (×3): qty 2

## 2024-03-21 MED ORDER — ATORVASTATIN CALCIUM 20 MG PO TABS
20.0000 mg | ORAL_TABLET | Freq: Every evening | ORAL | Status: DC
  Administered 2024-03-22: 20 mg via ORAL
  Filled 2024-03-21: qty 1

## 2024-03-21 MED ORDER — HEPARIN SODIUM (PORCINE) 5000 UNIT/ML IJ SOLN
5000.0000 [IU] | Freq: Three times a day (TID) | INTRAMUSCULAR | Status: DC
Start: 2024-03-22 — End: 2024-03-23
  Administered 2024-03-22 – 2024-03-23 (×4): 5000 [IU] via SUBCUTANEOUS
  Filled 2024-03-21 (×4): qty 1

## 2024-03-21 MED ORDER — SEVELAMER CARBONATE 800 MG PO TABS
800.0000 mg | ORAL_TABLET | Freq: Three times a day (TID) | ORAL | Status: DC
  Administered 2024-03-22 – 2024-03-23 (×2): 800 mg via ORAL
  Filled 2024-03-21 (×3): qty 1

## 2024-03-21 MED ORDER — ONDANSETRON HCL 4 MG/2ML IJ SOLN: 4.0000 mg | Freq: Four times a day (QID) | INTRAMUSCULAR | Status: DC | PRN

## 2024-03-21 MED ORDER — SODIUM CHLORIDE 0.9% FLUSH: 3.0000 mL | Freq: Once | INTRAVENOUS | Status: DC

## 2024-03-21 MED ORDER — CINACALCET HCL 30 MG PO TABS
30.0000 mg | ORAL_TABLET | Freq: Every day | ORAL | Status: DC
  Administered 2024-03-23: 30 mg via ORAL
  Filled 2024-03-21 (×2): qty 1

## 2024-03-21 MED ORDER — PNEUMOCOCCAL 20-VAL CONJ VACC 0.5 ML IM SUSY
0.5000 mL | PREFILLED_SYRINGE | INTRAMUSCULAR | Status: AC
  Administered 2024-03-22: 0.5 mL via INTRAMUSCULAR
  Filled 2024-03-21: qty 0.5

## 2024-03-21 MED ORDER — AMLODIPINE BESYLATE 10 MG PO TABS
10.0000 mg | ORAL_TABLET | Freq: Every day | ORAL | Status: DC
  Administered 2024-03-22 – 2024-03-23 (×2): 10 mg via ORAL
  Filled 2024-03-21 (×2): qty 1

## 2024-03-21 MED ORDER — ONDANSETRON HCL 4 MG PO TABS: 4.0000 mg | ORAL_TABLET | Freq: Four times a day (QID) | ORAL | Status: DC | PRN

## 2024-03-21 MED ORDER — INFLUENZA VAC SPLIT HIGH-DOSE 0.5 ML IM SUSY
0.5000 mL | PREFILLED_SYRINGE | INTRAMUSCULAR | Status: AC
  Administered 2024-03-22: 0.5 mL via INTRAMUSCULAR
  Filled 2024-03-21: qty 0.5

## 2024-03-21 NOTE — ED Triage Notes (Signed)
 Pt to ed from home via POV for headache, dizziness and weakness since yesterday morning. Pt is caox4, in no acute distress in triage. Pt was given a new medication for her eyes and these symptoms started. Pt was prescribed Valcylovir.

## 2024-03-21 NOTE — ED Notes (Signed)
 Unable to get blue and red top,

## 2024-03-21 NOTE — ED Provider Notes (Signed)
 [facility] Provider Note    Event Date/Time   First MD Initiated Contact with Patient 03/21/24 1916     (approximate)   History   Headache  Spanish video interpreter. [facility] (spelling)  HPI  Vanessa Salazar is a 68 y.o. female  HTN, TIA, T2DM, and ESRD on hemodialysis    History obtained from patient's son.  Patient's husband also at the bedside.   Patient recently evaluated on September 24 by [facility] ophthalmology.  At that time she was started on Valtrex.  Also noted to have a history of anterior uveitis.  The patient is able to report that she has felt dizzy or not well since 6 a day or 2.  She continues to feel a sense of dizziness or lightheadedness that is hard to describe or cloudiness.  She has a very mild headache.  No pain anywhere no nausea or vomiting.  Patient's son describes that she has been confused and disoriented throughout today which is very atypical.  A little more fatigued than typical and she is normally more alert and conversant but has been tired since Saturday.  They have noted that she has complained of feeling dizzy.  She still speaking she is not weak in any side, she does chronically have difficulty with the right leg due to severe problems with the right hip that are chronic  Physical Exam   Triage Vital Signs: ED Triage Vitals  Encounter Vitals Group     BP 03/21/24 1856 (!) 172/60     Girls Systolic BP Percentile --      Girls Diastolic BP Percentile --      Boys Systolic BP Percentile --      Boys Diastolic BP Percentile --      Pulse Rate 03/21/24 1856 (!) 59     Resp 03/21/24 1856 16     Temp 03/21/24 1856 98.2 F (36.8 C)     Temp Source 03/21/24 1856 Oral     SpO2 03/21/24 1856 98 %     Weight --      Height 03/21/24 1854 5' 2 (1.575 m)     Head Circumference --      Peak Flow --      Pain Score 03/21/24 1856 0     Pain Loc --      Pain Education --      Exclude from Growth Chart --     Most  recent vital signs: Vitals:   03/21/24 1856 03/21/24 2000  BP: (!) 172/60 (!) 164/82  Pulse: (!) 59 64  Resp: 16 15  Temp: 98.2 F (36.8 C)   SpO2: 98% 100%     General: Awake, no distress.  She is pleasant.  She is alert oriented to year and family at the bedside and being at a hospital at this time.  Her mental status is slightly fatigued but not overtly somnolent and she is not obtunded.  She demonstrates no neck stiffness or rigidity. Her eyes demonstrate corneal clouding bilateral right greater than left. CV:  Good peripheral perfusion.  Normal tone Resp:  Normal effort.  Clear bilateral Abd:  No distention.  Soft nontender Other:  Moves extremities well without deficit with exception to the right lower extremity where pain limits flexion and extension but reports this is chronic.  No pronator drift.  Speech appears clear to me at this time.  Mental status slightly fatigued but no distress   ED Results / Procedures /  Treatments   Labs (all labs ordered are listed, but only abnormal results are displayed) Labs Reviewed  CBC - Abnormal; Notable for the following components:      Result Value   WBC 3.9 (*)    RBC 3.28 (*)    Hemoglobin 10.5 (*)    HCT 32.1 (*)    Platelets 132 (*)    All other components within normal limits  COMPREHENSIVE METABOLIC PANEL WITH GFR - Abnormal; Notable for the following components:   Sodium 134 (*)    Chloride 94 (*)    Glucose, Bld 208 (*)    BUN 32 (*)    Creatinine, Ser 5.76 (*)    Alkaline Phosphatase 156 (*)    Total Bilirubin 1.3 (*)    GFR, Estimated 8 (*)    All other components within normal limits  CBG MONITORING, ED - Abnormal; Notable for the following components:   Glucose-Capillary 205 (*)    All other components within normal limits  PROTIME-INR  APTT  ETHANOL  DIFFERENTIAL  RPR  TSH     EKG  And interpreted by me at 2020 heart rate 60 QRS 140 QTc 460.  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   RADIOLOGY  CT HEAD WO CONTRAST Result Date: 03/21/2024 CLINICAL DATA:  Headache, dizziness, weakness EXAM: CT HEAD WITHOUT CONTRAST TECHNIQUE: Contiguous axial images were obtained from the base of the skull through the vertex without intravenous contrast. RADIATION DOSE REDUCTION: This exam was performed according to the departmental dose-optimization program which includes automated exposure control, adjustment of the mA and/or kV according to patient size and/or use of iterative reconstruction technique. COMPARISON:  None Available. 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 Old right thalamic lacunar infarct. No acute intracranial abnormality. Specifically, no hemorrhage, hydrocephalus, mass lesion, acute infarction, or significant intracranial injury. Vascular: No hyperdense vessel or unexpected calcification. Skull: No acute calvarial abnormality. Sinuses/Orbits: No acute findings Other: None IMPRESSION: No acute intracranial abnormality. 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 Electronically Signed   By: Franky Crease M.D.   On: 03/21/2024 19:29   CT head interpreted by me as grossly negative for acute finding.  Radiologist does note old infarct  MRI pending at time of admission   PROCEDURES:  Critical Care performed: No  Procedures   MEDICATIONS ORDERED IN ED: Medications  sodium chloride  flush (NS) 0.9 % injection 3 mL (3 mLs Intravenous Not Given 03/21/24 1903)     IMPRESSION / MDM / ASSESSMENT AND PLAN / ED COURSE  I reviewed the triage vital signs and the nursing notes.                              Differential diagnosis includes, but is not limited to, suspicious for medication effect, but other considerations such as central cause, infection metabolic derangement etc. are considered.  Overall reassuring exam except for the concerns of fairly significant disorientation reported by the family throughout the course of today starting  Saturday.  It seems to be somewhat improving as she is now oriented to her family and year but does seem fatigued, and family reports she is not to her typical level of alertness.  She does not complain of any headache at this time but apparently earlier did.  She has no rash on her face no shingles-like findings.  Appears that Valtrex was started as more of an empiric treatment and not a direct  treatment for concerns of acute viral illness.  She is not febrile her white count is slightly suppressed but this appears to be chronic  Patient's presentation is most consistent with acute complicated illness / injury requiring diagnostic workup.      Clinical Course as of 03/21/24 2052  [place] Mar 21, 2024  2000 Per son patient with confusion since Saturday evening, not quite acting self. Some confusion. Family associates started not long after taking new medication for her eyes on Friday / cornea.  [MQ]  2001 Last dialysis Friday. Doesn'st walk at baseline due to hip issues. No falls. [MQ]    Clinical Course User Index [MQ] Dicky Anes, MD   Dr. Myrna reviewed [facility] Op[facility] notes, advises the Valtrex appears to be an emperic treatment for corneal abnormality. He recommends based on this eval and the symptoms, would recommend stop Valtrex at this time as would be a suspect for her altered sensorium.   Discussed with patient and family via interpreter.  Due to concerns of altered mental status, at this time plan to observe her for further workup including MRI holding Valtrex and further as deemed by hospitalist service  Excepted to hospital service by Dr. Manfred  FINAL CLINICAL IMPRESSION(S) / ED DIAGNOSES   Final diagnoses:  Disorientation  Dizziness  Adverse effect of drug, initial encounter     Rx / DC Orders   ED Discharge Orders     None        Note:  This document was prepared using Dragon voice recognition software and may include unintentional dictation errors.   Dicky Anes,  MD 03/22/24 [phone]

## 2024-03-21 NOTE — H&P (Signed)
 History and Physical    Patient: Vanessa Salazar FMW:[id number] DOB: 1955/07/22 DOA: 03/21/2024 DOS: the patient was seen and examined on 03/21/2024 PCP: Ernie Yancy Roof, MD  Patient coming from: Home  Chief Complaint:  Chief Complaint  Patient presents with   Headache   HPI: Vanessa Salazar is a 68 y.o. female with medical history significant of hypertension, T2DM, ESRD on HD (MWF), history of anterior uveitis, severe glaucoma of both eyes who presents to the emergency department due to altered mental status which started yesterday.  Patient followed up with [facility] ophthalmology on Wednesday night 03/17/2024 due to corneal abnormality and she was prescribed Valtrex, she complained of lightheadedness on Friday (9/26) and on Saturday morning she complained of some dizziness with progressively worsened as the day progressed, she complained of a mild headache, ongoing dizziness and some confusion today.  Husband at bedside states that patient was unable to ambulate due to high risk of fall related to the dizziness.  She denies any weakness on any side of the extremities, though she endorsed chronic right hip problems which causes a disturbance with right leg on walking.  She denies chest pain, shortness of breath, fever, chills.  Patient endorsed having similar reaction to medication in the past.  Chart was reviewed and was noted that patient was admitted to [facility] healthcare in August 2023 and patient was suspected to have vertigo s/p viral illness at that time or due to reaction to medications (gabapentin and tramadol ) which was held at that time.  ED Course:  In the emergency department, HR was 59 bpm, BP 172/68, other vital signs are within normal range.  Workup in ED showed WBC of 3.9, hemoglobin 10.5, hematocrit 32.1, MCV 97.9, platelets 132.  Sodium 134, potassium 4.8, chloride 94, bicarb 25, blood glucose 208, BUN 32, creatinine 5.76, ALP 156.  Bilirubin 1.3.  Alcohol level was less  than 15, TSH 7.592 CT head without contrast showed no acute intracranial abnormality MRI of head without contrast showed no acute intracranial abnormality.  Encephalomalacia with chronic hemosiderin staining involving the left occipital lobe, consistent with remote hemorrhage and/or hemorrhagic infarct. Innumerable chronic micro hemorrhages involving the cerebellum, brainstem, and both cerebral hemispheres. These are most pronounced centrally, and favored to reflect sequelae of chronic poorly controlled hypertension. Underlying mild-to-moderate chronic microvascular ischemic disease with multiple remote lacunar infarcts. TRH was asked to admit patient.  Review of Systems: Review of systems as noted in the HPI. All other systems reviewed and are negative.   Past Medical History:  Diagnosis Date   Diabetes mellitus without complication (HCC)    ESRD on hemodialysis (HCC) 2007   Hypertension    Past Surgical History:  Procedure Laterality Date   CESAREAN SECTION     X3   EYE SURGERY      Social History:  reports that she has never smoked. She has never used smokeless tobacco. She reports that she does not drink alcohol and does not use drugs.   No Known Allergies  Family History  Problem Relation Age of Onset   Diabetes Father      Prior to Admission medications   Medication Sig Start Date End Date Taking? Authorizing Provider  acetaminophen  (TYLENOL ) 325 MG tablet Take 650 mg by mouth every 6 (six) hours as needed.    [provider]  amLODipine  (NORVASC ) 10 MG tablet Take 10 mg by mouth daily.    [provider]  atorvastatin  (LIPITOR) 20 MG tablet Take 20  mg by mouth every evening.    [provider]  cinacalcet  (SENSIPAR ) 30 MG tablet Take 30 mg by mouth daily. 05/21/16   [provider]  furosemide  (LASIX ) 40 MG tablet Take 80 mg by mouth daily. 05/10/16   [provider]  hydrALAZINE  (APRESOLINE ) 100 MG tablet Take 100 mg by  mouth 2 (two) times daily. 08/30/15   [provider]  HYDROcodone -acetaminophen  (NORCO) 7.5-325 MG tablet Take 1 tablet by mouth every 8 (eight) hours as needed for moderate pain. 09/02/16   Tobie Press, MD  ibuprofen  (ADVIL ,MOTRIN ) 400 MG tablet Take 1 tablet (400 mg total) by mouth every 6 (six) hours as needed. 09/02/16   Tobie Press, MD  insulin  NPH-regular Human (NOVOLIN 70/30) (70-30) 100 UNIT/ML injection Inject 8-10 Units into the skin 2 (two) times daily. 05/01/12   [provider]  lidocaine  (LIDODERM ) 5 % Place 1 patch onto the skin daily. Remove & Discard patch within 12 hours or as directed by MD 09/02/16   Tobie Press, MD  methocarbamol  (ROBAXIN ) 500 MG tablet Take 1 tablet (500 mg total) by mouth every 8 (eight) hours as needed for muscle spasms. 09/02/16   Tobie Press, MD  OMEPRAZOLE PO Take 1 capsule by mouth daily.    [provider]  oxyCODONE  (OXYCONTIN ) 10 mg 12 hr tablet Take 1 tablet (10 mg total) by mouth every 12 (twelve) hours. 09/02/16   Tobie Press, MD  RENVELA  800 MG tablet Take by mouth. 01/18/22   [provider]  sevelamer  (RENAGEL ) 800 MG tablet Take 1,600 mg by mouth 4 (four) times daily as needed.    [provider]    Physical Exam: BP (!) 174/71   Pulse (!) 59   Temp 97.9 F (36.6 C) (Oral)   Resp 16   Ht 5' 2 (1.575 m)   LMP 06/24/2005 (Approximate) Comment: Pt reports it was in the same year she began Dialysis  SpO2 98%   BMI 19.88 kg/m   General: 68 y.o. year-old female well developed well nourished in no acute distress.  Fatigued, but alert and oriented x3. HEENT: NCAT, EOMI Neck: Supple, trachea medial Cardiovascular: Regular rate and rhythm with no rubs or gallops.  No thyromegaly or JVD noted.  No lower extremity edema. 2/4 pulses in all 4 extremities. Respiratory: Clear to auscultation with no wheezes or rales. Good inspiratory effort. Abdomen: Soft, nontender nondistended with normal  bowel sounds x4 quadrants. Muskuloskeletal: No cyanosis, clubbing or edema noted bilaterally Neuro: CN II-XII intact, strength 5/5 x 4, sensation, reflexes intact Skin: No ulcerative lesions noted or rashes Psychiatry: Judgement and insight appear normal. Mood is appropriate for condition and setting          Labs on Admission:  Basic Metabolic Panel: Recent Labs  Lab 03/21/24 1910  NA 134*  K 4.8  CL 94*  CO2 25  GLUCOSE 208*  BUN 32*  CREATININE 5.76*  CALCIUM  9.8   Liver Function Tests: Recent Labs  Lab 03/21/24 1910  AST 26  ALT 8  ALKPHOS 156*  BILITOT 1.3*  PROT 7.1  ALBUMIN 4.0   No results for input(s): LIPASE, AMYLASE in the last 168 hours. No results for input(s): AMMONIA in the last 168 hours. CBC: Recent Labs  Lab 03/21/24 1910  WBC 3.9*  NEUTROABS 2.8  HGB 10.5*  HCT 32.1*  MCV 97.9  PLT 132*   Cardiac Enzymes: No results for input(s): CKTOTAL, CKMB, CKMBINDEX, TROPONINI in the last 168 hours.  BNP (last 3 results) No results for input(s): BNP in the last 8760 hours.  ProBNP (last 3 results) No results for input(s): PROBNP in the last 8760 hours.  CBG: Recent Labs  Lab 03/21/24 1901  GLUCAP 205*    Radiological Exams on Admission: MR BRAIN WO CONTRAST Result Date: 03/21/2024 CLINICAL DATA:  Initial evaluation for acute delirium, headache, dizziness. EXAM: MRI HEAD WITHOUT CONTRAST TECHNIQUE: Multiplanar, multiecho pulse sequences of the brain and surrounding structures were obtained without intravenous contrast. COMPARISON:  CT from earlier the same day. FINDINGS: Brain: Cerebral volume within normal limits. Patchy T2/FLAIR hyperintensity involving the periventricular and deep white matter both cerebral hemispheres as well as the pons, consistent with chronic small vessel ischemic disease, mild-to-moderate in nature. Encephalomalacia and gliosis involving the left occipital lobe (series 12, image 23). Prominent chronic  hemosiderin staining at this location. Finding likely reflects sequelae of remote hemorrhage and/or hemorrhagic infarct. Few additional small remote left cerebellar infarcts noted. Chronic lacunar infarcts at the left lentiform nucleus, right thalamus, right periatrial white matter, and pons. No abnormal foci of restricted diffusion to suggest acute or subacute ischemia. No other areas of chronic cortical infarction. No acute intracranial hemorrhage. Innumerable chronic micro hemorrhages are seen involving the cerebellum, brainstem, and both cerebral hemispheres. These are most pronounced centrally, and favored to reflect sequelae of chronic poorly controlled hypertension. No mass lesion, midline shift or mass effect. No hydrocephalus or extra-axial fluid collection. Pituitary gland within normal limits. Vascular: Major intracranial vascular flow voids are maintained. Skull and upper cervical spine: Craniocervical junction normal limits. Decreased T1 signal intensity within the visualized bone marrow, nonspecific, but most commonly related to anemia, smoking or obesity. No scalp soft tissue abnormality. Sinuses/Orbits: Prior bilateral ocular lens replacement. Paranasal sinuses are clear. Large bilateral mastoid effusions noted, of uncertain significance. Visualized nasopharynx unremarkable. Other: None. IMPRESSION: 1. No acute intracranial abnormality. 2. Encephalomalacia with chronic hemosiderin staining involving the left occipital lobe, consistent with remote hemorrhage and/or hemorrhagic infarct. 3. Innumerable chronic micro hemorrhages involving the cerebellum, brainstem, and both cerebral hemispheres. These are most pronounced centrally, and favored to reflect sequelae of chronic poorly controlled hypertension. 4. Underlying mild-to-moderate chronic microvascular ischemic disease with multiple remote lacunar infarcts as above. 5. Large bilateral mastoid effusions, of uncertain significance. Correlation with  physical exam recommended. Electronically Signed   By: Morene Hoard M.D.   On: 03/21/2024 21:29   CT HEAD WO CONTRAST Result Date: 03/21/2024 CLINICAL DATA:  Headache, dizziness, weakness EXAM: CT HEAD WITHOUT CONTRAST TECHNIQUE: Contiguous axial images were obtained from the base of the skull through the vertex without intravenous contrast. RADIATION DOSE REDUCTION: This exam was performed according to the departmental dose-optimization program which includes automated exposure control, adjustment of the mA and/or kV according to patient size and/or use of iterative reconstruction technique. COMPARISON:  None Available. 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 Old right thalamic lacunar infarct. No acute intracranial abnormality. Specifically, no hemorrhage, hydrocephalus, mass lesion, acute infarction, or significant intracranial injury. Vascular: No hyperdense vessel or unexpected calcification. Skull: No acute calvarial abnormality. Sinuses/Orbits: No acute findings Other: None IMPRESSION: No acute intracranial abnormality. 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 Electronically Signed   By: Franky Crease M.D.   On: 03/21/2024 19:29    EKG: I independently viewed the EKG done and my findings are as followed: Sinus bradycardia at rate of 57 bpm with multiple PVCs, RBBB.  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 This  may be secondary to adverse drug effect CT and MRI of head without contrast showed no acute intracranial abnormality Valtrex will be held at this time Monitor for improvement in patient's symptoms s/p dialysis tomorrow Continue fall precaution Consider neurology consult if no improvement in symptoms Continue PT/OT eval and treat  ESRD on HD (MWF) Last dialysis was on Friday (9/26) Continue Sensipar  Nephrology will be consulted for dialysis in the morning.  Leukopenia/thrombocytopenia (Chronic) WBC was 3.9 (most recent lab for comparison was 2.8 on 09/01/2016. Platelets 132 (last lab for comparison was March 2018 and it was 125) Continue to monitor WBC and platelets  Elevated TSH TSH 7.592, free T4 will be checked  Anemia of chronic disease Hemoglobin 10.5, stable, continue to monitor H/H  T2DM with hyperglycemia Continue ISS and hypoglycemic protocol  Essential hypertension Continue amlodipine , hydralazine   Mixed hyperlipidemia Continue Lipitor  DVT prophylaxis: Subcu heparin   Code Status: Full code  Family Communication: Husband at bedside (all questions answered to satisfaction)  Consults: Nephrology  Severity of Illness: The appropriate patient status for this patient is OBSERVATION. Observation status is judged to be reasonable and necessary in order to provide the required intensity of service to ensure the patient's safety. The patient's presenting symptoms, physical exam findings, and initial radiographic and laboratory data in the context of their medical condition is felt to place them at decreased risk for further clinical deterioration. Furthermore, it is anticipated that the patient will be medically stable for discharge from the hospital within 2 midnights of admission.   Author: Jaylinn Hellenbrand, DO 03/21/2024 11:02 PM  For on call review [email or web address].

## 2024-03-22 DIAGNOSIS — R410 Disorientation, unspecified: Secondary | ICD-10-CM

## 2024-03-22 LAB — CBC
HCT: 27.8 % — ABNORMAL LOW (ref 36.0–46.0)
Hemoglobin: 9.6 g/dL — ABNORMAL LOW (ref 12.0–15.0)
MCH: 32.8 pg (ref 26.0–34.0)
MCHC: 34.5 g/dL (ref 30.0–36.0)
MCV: 94.9 fL (ref 80.0–100.0)
Platelets: 111 K/uL — ABNORMAL LOW (ref 150–400)
RBC: 2.93 MIL/uL — ABNORMAL LOW (ref 3.87–5.11)
RDW: 13.7 % (ref 11.5–15.5)
WBC: 3.8 K/uL — ABNORMAL LOW (ref 4.0–10.5)
nRBC: 0 % (ref 0.0–0.2)

## 2024-03-22 LAB — COMPREHENSIVE METABOLIC PANEL WITH GFR
ALT: <5 U/L (ref 0–44)
AST: 19 U/L (ref 15–41)
Albumin: 3.6 g/dL (ref 3.5–5.0)
Alkaline Phosphatase: 133 U/L — ABNORMAL HIGH (ref 38–126)
Anion gap: 14 (ref 5–15)
BUN: 32 mg/dL — ABNORMAL HIGH (ref 8–23)
CO2: 26 mmol/L (ref 22–32)
Calcium: 9.7 mg/dL (ref 8.9–10.3)
Chloride: 96 mmol/L — ABNORMAL LOW (ref 98–111)
Creatinine, Ser: 5.97 mg/dL — ABNORMAL HIGH (ref 0.44–1.00)
GFR, Estimated: 7 mL/min — ABNORMAL LOW (ref >60)
Glucose, Bld: 186 mg/dL — ABNORMAL HIGH (ref 70–99)
Potassium: 4.1 mmol/L (ref 3.5–5.1)
Sodium: 136 mmol/L (ref 135–145)
Total Bilirubin: 0.9 mg/dL (ref 0.0–1.2)
Total Protein: 6.3 g/dL — ABNORMAL LOW (ref 6.5–8.1)

## 2024-03-22 LAB — GLUCOSE, CAPILLARY
Glucose-Capillary: 179 mg/dL — ABNORMAL HIGH (ref 70–99)
Glucose-Capillary: 127 mg/dL — ABNORMAL HIGH (ref 70–99)
Glucose-Capillary: 148 mg/dL — ABNORMAL HIGH (ref 70–99)

## 2024-03-22 LAB — RPR: RPR Ser Ql: NONREACTIVE

## 2024-03-22 LAB — PHOSPHORUS: Phosphorus: 3.2 mg/dL (ref 2.5–4.6)

## 2024-03-22 LAB — MAGNESIUM: Magnesium: 2.4 mg/dL (ref 1.7–2.4)

## 2024-03-22 LAB — HEMOGLOBIN A1C
Hgb A1c MFr Bld: 6 % — ABNORMAL HIGH (ref 4.8–5.6)
Mean Plasma Glucose: 125.5 mg/dL

## 2024-03-22 LAB — HIV ANTIBODY (ROUTINE TESTING W REFLEX): HIV Screen 4th Generation wRfx: NONREACTIVE

## 2024-03-22 LAB — T4, FREE: Free T4: 1.11 ng/dL (ref 0.61–1.12)

## 2024-03-22 LAB — HEPATITIS B SURFACE ANTIGEN: Hepatitis B Surface Ag: NONREACTIVE

## 2024-03-22 MED ORDER — CHLORHEXIDINE GLUCONATE CLOTH 2 % EX PADS
6.0000 | MEDICATED_PAD | Freq: Every day | CUTANEOUS | Status: DC
  Administered 2024-03-23: 6 via TOPICAL

## 2024-03-22 NOTE — Consult Note (Signed)
 [facility]  CONSULT NOTE    Date: 03/22/2024                  Patient Name:  Vanessa Salazar  MRN: [id number]  DOB: September 27, 1955  Age / Sex: 68 y.o., female         PCP: Ernie Yancy Roof, MD                 Service Requesting Consult: TRH                 Reason for Consult: End stage renal disease on hemodialysis             History of Present Illness: Vanessa Salazar is a 68 y.o.  female with past medical history of diabetes, hypertension, glaucoma, and end stage renal disease on hemodialysis, who was admitted to [facility] on 03/21/2024 for Dizziness [R42] Altered mental status [R41.82] Disorientation [R41.0] Adverse effect of drug, initial encounter [T50.905A]  Patient received outpatient dialysis at [facility] on a MWF schedule, supervised by [facility] physicians. Last treatment on Friday. Patient is seen and evaluated during dialysis. Tolerating treatment well.No complaints, denies pain. Room air   Labs on ED arrival unremarkable for renal patient. Imaging negative for acute findings.    Medications: Outpatient medications: Medications Prior to Admission  Medication Sig Dispense Refill Last Dose/Taking   acetaminophen  (TYLENOL ) 325 MG tablet Take 650 mg by mouth every 6 (six) hours as needed.      amLODipine  (NORVASC ) 10 MG tablet Take 10 mg by mouth daily.      atorvastatin  (LIPITOR) 20 MG tablet Take 20 mg by mouth every evening.      cinacalcet  (SENSIPAR ) 30 MG tablet Take 30 mg by mouth daily.      furosemide  (LASIX ) 40 MG tablet Take 80 mg by mouth daily.      hydrALAZINE  (APRESOLINE ) 100 MG tablet Take 100 mg by mouth 2 (two) times daily.      HYDROcodone -acetaminophen  (NORCO) 7.5-325 MG tablet Take 1 tablet by mouth every 8 (eight) hours as needed for moderate pain. 30 tablet 0    ibuprofen  (ADVIL ,MOTRIN ) 400 MG tablet Take 1 tablet (400 mg total) by mouth every 6 (six) hours as needed. 30 tablet 0    insulin  NPH-regular Human  (NOVOLIN 70/30) (70-30) 100 UNIT/ML injection Inject 8-10 Units into the skin 2 (two) times daily.      lidocaine  (LIDODERM ) 5 % Place 1 patch onto the skin daily. Remove & Discard patch within 12 hours or as directed by MD 30 patch 0    methocarbamol  (ROBAXIN ) 500 MG tablet Take 1 tablet (500 mg total) by mouth every 8 (eight) hours as needed for muscle spasms. 20 tablet 0    OMEPRAZOLE PO Take 1 capsule by mouth daily.      oxyCODONE  (OXYCONTIN ) 10 mg 12 hr tablet Take 1 tablet (10 mg total) by mouth every 12 (twelve) hours. 20 tablet 0    RENVELA  800 MG tablet Take by mouth.      sevelamer  (RENAGEL ) 800 MG tablet Take 1,600 mg by mouth 4 (four) times daily as needed.       Current medications: Current Facility-Administered Medications  Medication Dose Route Frequency Provider Last Rate Last Admin   acetaminophen  (TYLENOL ) tablet 650 mg  650 mg Oral Q6H PRN Adefeso, Oladapo, DO       Or   acetaminophen  (TYLENOL ) suppository 650 mg  650 mg Rectal Q6H  PRN Adefeso, Oladapo, DO       amLODipine  (NORVASC ) tablet 10 mg  10 mg Oral Daily Adefeso, Oladapo, DO       atorvastatin  (LIPITOR) tablet 20 mg  20 mg Oral QPM Adefeso, Oladapo, DO       Chlorhexidine Gluconate Cloth 2 % PADS 6 each  6 each Topical Q0600 Druscilla Bald, NP       cinacalcet  (SENSIPAR ) tablet 30 mg  30 mg Oral Q breakfast Adefeso, Oladapo, DO       heparin  injection 5,000 Units  5,000 Units Subcutaneous Q8H Adefeso, Oladapo, DO   5,000 Units at 03/22/24 9353   hydrALAZINE  (APRESOLINE ) tablet 100 mg  100 mg Oral BID Adefeso, Oladapo, DO   100 mg at 03/21/24 2328   Influenza vac split trivalent PF (FLUZONE HIGH-DOSE) injection 0.5 mL  0.5 mL Intramuscular Tomorrow-1000 Adefeso, Oladapo, DO       insulin  aspart (novoLOG ) injection 0-6 Units  0-6 Units Subcutaneous TID WC Adefeso, Oladapo, DO   1 Units at 03/22/24 9157   ondansetron  (ZOFRAN ) tablet 4 mg  4 mg Oral Q6H PRN Adefeso, Oladapo, DO       Or   ondansetron  (ZOFRAN )  injection 4 mg  4 mg Intravenous Q6H PRN Adefeso, Oladapo, DO       pneumococcal 20-valent conjugate vaccine (PREVNAR 20) injection 0.5 mL  0.5 mL Intramuscular Tomorrow-1000 Adefeso, Oladapo, DO       sevelamer  carbonate (RENVELA ) tablet 800 mg  800 mg Oral TID WC Adefeso, Oladapo, DO       sodium chloride  flush (NS) 0.9 % injection 3 mL  3 mL Intravenous Once Quale, Mark, MD          Allergies: No Known Allergies    Past Medical History: Past Medical History:  Diagnosis Date   Diabetes mellitus without complication (HCC)    ESRD on hemodialysis (HCC) 2007   Hypertension      Past Surgical History: Past Surgical History:  Procedure Laterality Date   CESAREAN SECTION     X3   EYE SURGERY       Family History: Family History  Problem Relation Age of Onset   Diabetes Father      Social History: Social History   Socioeconomic History   Marital status: Single    Spouse name: Not on file   Number of children: 3   Years of education: Not on file   Highest education level: 9th grade  Occupational History   Not on file  Tobacco Use   Smoking status: Never   Smokeless tobacco: Never  Vaping Use   Vaping status: Never Used  Substance and Sexual Activity   Alcohol use: No   Drug use: No   Sexual activity: Yes    Birth control/protection: Post-menopausal  Other Topics Concern   Not on file  Social History Narrative   Pt lives by herself.   Social Drivers of Corporate investment banker Strain: Low Risk  (02/11/2022)   Received from [facility]   Overall Financial Resource Strain (CARDIA)    Difficulty of Paying Living Expenses: Not hard at all  Food Insecurity: No Food Insecurity (03/21/2024)   Hunger Vital Sign    Worried About Running Out of Food in the Last Year: Never true    Ran Out of Food in the Last Year: Never true  Transportation Needs: No Transportation Needs (03/21/2024)   PRAPARE - Administrator, Civil Service (Medical): No  Lack of Transportation (Non-Medical): No  Physical Activity: Not on file  Stress: Not on file  Social Connections: Moderately Integrated (03/21/2024)   Social Connection and Isolation Panel    Frequency of Communication with Friends and Family: More than three times a week    Frequency of Social Gatherings with Friends and Family: Once a week    Attends Religious Services: More than 4 times per year    Active Member of [employer] or Organizations: No    Attends Banker Meetings: Never    Marital Status: Married  Catering manager Violence: Not At Risk (03/21/2024)   Humiliation, Afraid, Rape, and Kick questionnaire    Fear of Current or Ex-Partner: No    Emotionally Abused: No    Physically Abused: No    Sexually Abused: No     Review of Systems: Review of Systems  Constitutional:  Negative for chills, fever and malaise/fatigue.  HENT:  Negative for congestion, sore throat and tinnitus.   Eyes:  Negative for blurred vision and redness.  Respiratory:  Negative for cough, shortness of breath and wheezing.   Cardiovascular:  Negative for chest pain, palpitations, claudication and leg swelling.  Gastrointestinal:  Negative for abdominal pain, blood in stool, diarrhea, nausea and vomiting.  Genitourinary:  Negative for flank pain, frequency and hematuria.  Musculoskeletal:  Negative for back pain, falls and myalgias.  Skin:  Negative for rash.  Neurological:  Positive for dizziness. Negative for weakness and headaches.  Endo/Heme/Allergies:  Does not bruise/bleed easily.  Psychiatric/Behavioral:  Negative for depression. The patient is not nervous/anxious and does not have insomnia.     Vital Signs: Blood pressure (!) (P) 157/66, pulse (P) 66, temperature 98.1 F (36.7 C), temperature source Oral, resp. rate (P) 15, height 5' 2 (1.575 m), last menstrual period 06/24/2005, SpO2 99%.  Weight trends: There were no vitals filed for this visit.  Physical Exam: General: NAD,    Head: Normocephalic, atraumatic. Moist oral mucosal membranes  Eyes: Anicteric  Lungs:  Clear to auscultation, normal effort  Heart: Regular rate and rhythm  Abdomen:  Soft, nontender,   Extremities:  Trace peripheral edema.  Neurologic: Alert, awake  Skin: No lesions  Access: Rt permcath, Lt AVF     Lab results: Basic Metabolic Panel: Recent Labs  Lab 03/21/24 1910 03/22/24 0349  NA 134* 136  K 4.8 4.1  CL 94* 96*  CO2 25 26  GLUCOSE 208* 186*  BUN 32* 32*  CREATININE 5.76* 5.97*  CALCIUM  9.8 9.7  MG  --  2.4  PHOS  --  3.2    Liver Function Tests: Recent Labs  Lab 03/21/24 1910 03/22/24 0349  AST 26 19  ALT 8 <5  ALKPHOS 156* 133*  BILITOT 1.3* 0.9  PROT 7.1 6.3*  ALBUMIN 4.0 3.6   No results for input(s): LIPASE, AMYLASE in the last 168 hours. No results for input(s): AMMONIA in the last 168 hours.  CBC: Recent Labs  Lab 03/21/24 1910 03/22/24 0349  WBC 3.9* 3.8*  NEUTROABS 2.8  --   HGB 10.5* 9.6*  HCT 32.1* 27.8*  MCV 97.9 94.9  PLT 132* 111*    Cardiac Enzymes: No results for input(s): CKTOTAL, CKMB, CKMBINDEX, TROPONINI in the last 168 hours.  BNP: Invalid input(s): POCBNP  CBG: Recent Labs  Lab 03/21/24 1901 03/21/24 2313 03/22/24 0740  GLUCAP 205* 219* 179*    Microbiology: Results for orders placed or performed during the hospital encounter of 03/21/24  MRSA Next Gen  by PCR, Nasal     Status: None   Collection Time: 03/21/24 10:18 PM   Specimen: Nasal Mucosa; Nasal Swab  Result Value Ref Range Status   MRSA by PCR Next Gen NOT DETECTED NOT DETECTED Final    Comment: (NOTE) The GeneXpert MRSA Assay (FDA approved for NASAL specimens only), is one component of a comprehensive MRSA colonization surveillance program. It is not intended to diagnose MRSA infection nor to guide or monitor treatment for MRSA infections. Test performance is not FDA approved in patients less than 45 years old. Performed at [facility], [address] [place], [place] [address]     Coagulation Studies: Recent Labs    03/21/24 1950  LABPROT 15.2  INR 1.1    Urinalysis: No results for input(s): COLORURINE, LABSPEC, PHURINE, GLUCOSEU, HGBUR, BILIRUBINUR, KETONESUR, PROTEINUR, UROBILINOGEN, NITRITE, LEUKOCYTESUR in the last 72 hours.  Invalid input(s): APPERANCEUR    Imaging: MR BRAIN WO CONTRAST Result Date: 03/21/2024 CLINICAL DATA:  Initial evaluation for acute delirium, headache, dizziness. EXAM: MRI HEAD WITHOUT CONTRAST TECHNIQUE: Multiplanar, multiecho pulse sequences of the brain and surrounding structures were obtained without intravenous contrast. COMPARISON:  CT from earlier the same day. FINDINGS: Brain: Cerebral volume within normal limits. Patchy T2/FLAIR hyperintensity involving the periventricular and deep white matter both cerebral hemispheres as well as the pons, consistent with chronic small vessel ischemic disease, mild-to-moderate in nature. Encephalomalacia and gliosis involving the left occipital lobe (series 12, image 23). Prominent chronic hemosiderin staining at this location. Finding likely reflects sequelae of remote hemorrhage and/or hemorrhagic infarct. Few additional small remote left cerebellar infarcts noted. Chronic lacunar infarcts at the left lentiform nucleus, right thalamus, right periatrial white matter, and pons. No abnormal foci of restricted diffusion to suggest acute or subacute ischemia. No other areas of chronic cortical infarction. No acute intracranial hemorrhage. Innumerable chronic micro hemorrhages are seen involving the cerebellum, brainstem, and both cerebral hemispheres. These are most pronounced centrally, and favored to reflect sequelae of chronic poorly controlled hypertension. No mass lesion, midline shift or mass effect. No hydrocephalus or extra-axial fluid collection. Pituitary gland within normal limits. Vascular: Major intracranial  vascular flow voids are maintained. Skull and upper cervical spine: Craniocervical junction normal limits. Decreased T1 signal intensity within the visualized bone marrow, nonspecific, but most commonly related to anemia, smoking or obesity. No scalp soft tissue abnormality. Sinuses/Orbits: Prior bilateral ocular lens replacement. Paranasal sinuses are clear. Large bilateral mastoid effusions noted, of uncertain significance. Visualized nasopharynx unremarkable. Other: None. IMPRESSION: 1. No acute intracranial abnormality. 2. Encephalomalacia with chronic hemosiderin staining involving the left occipital lobe, consistent with remote hemorrhage and/or hemorrhagic infarct. 3. Innumerable chronic micro hemorrhages involving the cerebellum, brainstem, and both cerebral hemispheres. These are most pronounced centrally, and favored to reflect sequelae of chronic poorly controlled hypertension. 4. Underlying mild-to-moderate chronic microvascular ischemic disease with multiple remote lacunar infarcts as above. 5. Large bilateral mastoid effusions, of uncertain significance. Correlation with physical exam recommended. Electronically Signed   By: Morene Hoard M.D.   On: 03/21/2024 21:29   CT HEAD WO CONTRAST Result Date: 03/21/2024 CLINICAL DATA:  Headache, dizziness, weakness EXAM: CT HEAD WITHOUT CONTRAST TECHNIQUE: Contiguous axial images were obtained from the base of the skull through the vertex without intravenous contrast. RADIATION DOSE REDUCTION: This exam was performed according to the departmental dose-optimization program which includes automated exposure control, adjustment of the mA and/or kV according to patient size and/or use of iterative reconstruction technique. COMPARISON:  None Available. 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 Old right thalamic lacunar infarct. No acute intracranial abnormality. Specifically, no hemorrhage, hydrocephalus, mass lesion, acute infarction,  or significant intracranial injury. Vascular: No hyperdense vessel or unexpected calcification. Skull: No acute calvarial abnormality. Sinuses/Orbits: No acute findings Other: None IMPRESSION: No acute intracranial abnormality. 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 Electronically Signed   By: Franky Crease M.D.   On: 03/21/2024 19:29     Assessment & Plan: Vanessa Salazar is a 68 y.o.  female with past medical history of diabetes, hypertension, glaucoma, and end stage renal disease on hemodialysis, who was admitted to [facility] on 03/21/2024 for Dizziness [R42] Altered mental status [R41.82] Disorientation [R41.0] Adverse effect of drug, initial encounter [T50.905A]  Altered mental status, CT head and MRI brain negative. Workup in progress  2. End stage renal disease on hemodialysis. Last treatment on Friday. Completing treatment today per outpatient schedule. Next treatment scheduled for Wednesday  3. Anemia of chronic kidney disease Lab Results  Component Value Date   HGB 9.6 (L) 03/22/2024    Hgb stable, will consider low dose EPO with next treatment.   4. Secondary Hyperparathyroidism: with outpatient labs:one available Lab Results  Component Value Date   CALCIUM  9.7 03/22/2024   PHOS 3.2 03/22/2024    Calcium  and phos within optimal raange. Continue sevelamer  with meals.    LOS: 0 Chevelle Durr 9/29/20253:02 PM

## 2024-03-22 NOTE — Progress Notes (Signed)
 Patient returned from HD

## 2024-03-22 NOTE — Progress Notes (Signed)
 OT Cancellation Note  Patient Details Name: Vanessa Salazar MRN: [id number] DOB: 1956-05-30   Cancelled Treatment:    Reason Eval/Treat Not Completed: Other (comment). OT order received and chart review. Multiple attempts made to see pt but she remains off floor at dialysis. OT to re-attempt when pt is next available.   Izetta Claude, MS, OTR/L , CBIS ascom [phone]  03/22/24, 2:01 PM

## 2024-03-22 NOTE — Procedures (Signed)
 2 liters ultrafiltration, handoff report was given to the primary RN, and post hemodialysis condition remained stable.

## 2024-03-22 NOTE — TOC CM/SW Note (Signed)
 Transition of Care [facility]) - Inpatient Brief Assessment   Patient Details  Name: Vanessa Salazar MRN: [id number] Date of Birth: [birth year]  Transition of Care [facility]) CM/SW Contact:    Alfonso Rummer, LCSW Phone Number: 03/22/2024, 3:30 PM   Clinical Narrative: [name] Rummer completed TOC chart review No TOC needs identified please contact should need arise.    Transition of Care Asessment:

## 2024-03-22 NOTE — Progress Notes (Signed)
 Pt receives out-pt HD at [facility], [address] MWF with chair time @5 :00am. Navigator will assist as needed   Suzen Satchel Dialysis Navigator [phone]

## 2024-03-22 NOTE — Progress Notes (Signed)
 PT Cancellation Note  Patient Details Name: Woodie Trusty MRN: [id number] DOB: [birth year]   Cancelled Treatment:    Reason Eval/Treat Not Completed: Patient at procedure or test/unavailable, will attempt to see pt at a future date/time as medically appropriate.     CHARM Glendia Bertin PT, DPT 03/22/24, 10:15 AM

## 2024-03-22 NOTE — Plan of Care (Signed)
   Problem: Education: Goal: Knowledge of General Education information will improve Description: Including pain rating scale, medication(s)/side effects and non-pharmacologic comfort measures Outcome: Progressing   Problem: Clinical Measurements: Goal: Ability to maintain clinical measurements within normal limits will improve Outcome: Progressing Goal: Will remain free from infection Outcome: Progressing

## 2024-03-22 NOTE — Progress Notes (Signed)
 Progress Note    Vanessa Salazar  FMW:[id number] DOB: 1955/10/10  DOA: 03/21/2024 PCP: Ernie Yancy Roof, MD      Brief Narrative:    Medical records reviewed and are as summarized below:  Vanessa Salazar is a 68 y.o. female  with medical history significant of hypertension, T2DM, ESRD on HD (MWF), history of anterior uveitis, severe glaucoma of both eye, who presented to the hospital with altered mental status.  She was recently seen at [facility] ophthalmology clinic on 03/17/2024 for corneal abnormality and was prescribed Valtrex.  She developed general weakness, lightheadedness, dizziness, headache and confusion since starting the Valtrex.  She had difficulty ambulating because of worsening symptoms so she was brought to the ED for further evaluation.  CT head and MRI brain did not show any acute abnormality.      Assessment/Plan:   Principal Problem:   Altered mental status Active Problems:   ESRD on hemodialysis (HCC)   Leukopenia   Thrombocytopenia   Anemia of chronic disease   Elevated TSH   Type 2 diabetes mellitus with hyperglycemia (HCC)   Essential hypertension   Mixed hyperlipidemia    Body mass index is 19.88 kg/m.    Acute toxic encephalopathy (dizziness, confusion): Likely due to Valtrex.  Valtrex has been discontinued at the recommendation of Dr. Myrna, ophthalmologist, who was on duty on 03/21/2024. Confusion has resolved.  She is still feeling a little dizzy.   General weakness: PT evaluation   ESRD: She had hemodialysis today.  Follow-up with nephrologist.   Hypertension: Continue antihypertensives   Insulin -dependent diabetes mellitus: She takes insulin  NPH 8 to 10 units twice daily at home.   Elevated TSH/likely subclinical hypothyroidism: TSH 7.592 but free T4 was normal.  Check free T3.   Comorbidities include chronic anemia, chronic thrombocytopenia, hyperlipidemia, glaucoma  Plan discussed with the patient and her  husband at the bedside.  She prefers to stay for physical therapy evaluation.   Alan, was the [place] interpreter for this encounter.  Diet Order             Diet heart healthy/carb modified Room service appropriate? Yes; Fluid consistency: Thin  Diet effective now                                  Consultants: Nephrologist  Procedures: None    Medications:    amLODipine   10 mg Oral Daily   atorvastatin   20 mg Oral QPM   Chlorhexidine Gluconate Cloth  6 each Topical Q0600   cinacalcet   30 mg Oral Q breakfast   heparin   5,000 Units Subcutaneous Q8H   hydrALAZINE   100 mg Oral BID   Influenza vac split trivalent PF  0.5 mL Intramuscular Tomorrow-1000   insulin  aspart  0-6 Units Subcutaneous TID WC   pneumococcal 20-valent conjugate vaccine  0.5 mL Intramuscular Tomorrow-1000   sevelamer  carbonate  800 mg Oral TID WC   sodium chloride  flush  3 mL Intravenous Once   Continuous Infusions:   Anti-infectives (From admission, onward)    None              Family Communication/Anticipated D/C date and plan/Code Status   DVT prophylaxis: heparin  injection 5,000 Units Start: 03/22/24 0600 SCDs Start: 03/21/24 2253     Code Status: Full Code  Family Communication: Plan discussed with the husband at the bedside Disposition Plan: Plan to discharge home  Status is: Observation The patient will require care spanning > 2 midnights and should be moved to inpatient because: Dizziness       Subjective:   Interval events noted.  She was initially seen on the hemodialysis unit.  She was seen for the second time in her room when she came back from dialysis.  Her husband was at the bedside.  Alan, Spanish interpreter and Geni, RN, were at the bedside as well.  Objective:    Vitals:   03/22/24 1300 03/22/24 1400 03/22/24 1421 03/22/24 1426  BP: (!) 168/66 (!) 159/64  (!) (P) 157/66  Pulse: 66 62 61 (P) 66  Resp: 15 13 18  (P) 15  Temp:       TempSrc:      SpO2:  99%    Height:       No data found.  No intake or output data in the 24 hours ending 03/22/24 1509 There were no vitals filed for this visit.  Exam:  GEN: NAD SKIN: Warm and dry EYES: Abnormal right eye ENT: MMM CV: RRR PULM: CTA B ABD: soft, ND, NT, +BS CNS: AAO x 3, non focal EXT: No edema or tenderness        Data Reviewed:   I have personally reviewed following labs and imaging studies:  Labs: Labs show the following:   Basic Metabolic Panel: Recent Labs  Lab 03/21/24 1910 03/22/24 0349  NA 134* 136  K 4.8 4.1  CL 94* 96*  CO2 25 26  GLUCOSE 208* 186*  BUN 32* 32*  CREATININE 5.76* 5.97*  CALCIUM  9.8 9.7  MG  --  2.4  PHOS  --  3.2   GFR CrCl cannot be calculated (Unknown ideal weight.). Liver Function Tests: Recent Labs  Lab 03/21/24 1910 03/22/24 0349  AST 26 19  ALT 8 <5  ALKPHOS 156* 133*  BILITOT 1.3* 0.9  PROT 7.1 6.3*  ALBUMIN 4.0 3.6   No results for input(s): LIPASE, AMYLASE in the last 168 hours. No results for input(s): AMMONIA in the last 168 hours. Coagulation profile Recent Labs  Lab 03/21/24 1950  INR 1.1    CBC: Recent Labs  Lab 03/21/24 1910 03/22/24 0349  WBC 3.9* 3.8*  NEUTROABS 2.8  --   HGB 10.5* 9.6*  HCT 32.1* 27.8*  MCV 97.9 94.9  PLT 132* 111*   Cardiac Enzymes: No results for input(s): CKTOTAL, CKMB, CKMBINDEX, TROPONINI in the last 168 hours. BNP (last 3 results) No results for input(s): PROBNP in the last 8760 hours. CBG: Recent Labs  Lab 03/21/24 1901 03/21/24 2313 03/22/24 0740  GLUCAP 205* 219* 179*   D-Dimer: No results for input(s): DDIMER in the last 72 hours. Hgb A1c: Recent Labs    03/22/24 0345  HGBA1C 6.0*   Lipid Profile: No results for input(s): CHOL, HDL, LDLCALC, TRIG, CHOLHDL, LDLDIRECT in the last 72 hours. Thyroid function studies: Recent Labs    03/21/24 1910  TSH 7.592*   Anemia work up: No results for  input(s): VITAMINB12, FOLATE, FERRITIN, TIBC, IRON, RETICCTPCT in the last 72 hours. Sepsis Labs: Recent Labs  Lab 03/21/24 1910 03/22/24 0349  WBC 3.9* 3.8*    Microbiology Recent Results (from the past 240 hours)  MRSA Next Gen by PCR, Nasal     Status: None   Collection Time: 03/21/24 10:18 PM   Specimen: Nasal Mucosa; Nasal Swab  Result Value Ref Range Status   MRSA by PCR Next Gen NOT DETECTED NOT DETECTED Final  Comment: (NOTE) The GeneXpert MRSA Assay (FDA approved for NASAL specimens only), is one component of a comprehensive MRSA colonization surveillance program. It is not intended to diagnose MRSA infection nor to guide or monitor treatment for MRSA infections. Test performance is not FDA approved in patients less than 3 years old. Performed at [facility], [address] [place], [place] [address]     Procedures and diagnostic studies:  MR BRAIN WO CONTRAST Result Date: 03/21/2024 CLINICAL DATA:  Initial evaluation for acute delirium, headache, dizziness. EXAM: MRI HEAD WITHOUT CONTRAST TECHNIQUE: Multiplanar, multiecho pulse sequences of the brain and surrounding structures were obtained without intravenous contrast. COMPARISON:  CT from earlier the same day. FINDINGS: Brain: Cerebral volume within normal limits. Patchy T2/FLAIR hyperintensity involving the periventricular and deep white matter both cerebral hemispheres as well as the pons, consistent with chronic small vessel ischemic disease, mild-to-moderate in nature. Encephalomalacia and gliosis involving the left occipital lobe (series 12, image 23). Prominent chronic hemosiderin staining at this location. Finding likely reflects sequelae of remote hemorrhage and/or hemorrhagic infarct. Few additional small remote left cerebellar infarcts noted. Chronic lacunar infarcts at the left lentiform nucleus, right thalamus, right periatrial white matter, and pons. No abnormal foci of restricted  diffusion to suggest acute or subacute ischemia. No other areas of chronic cortical infarction. No acute intracranial hemorrhage. Innumerable chronic micro hemorrhages are seen involving the cerebellum, brainstem, and both cerebral hemispheres. These are most pronounced centrally, and favored to reflect sequelae of chronic poorly controlled hypertension. No mass lesion, midline shift or mass effect. No hydrocephalus or extra-axial fluid collection. Pituitary gland within normal limits. Vascular: Major intracranial vascular flow voids are maintained. Skull and upper cervical spine: Craniocervical junction normal limits. Decreased T1 signal intensity within the visualized bone marrow, nonspecific, but most commonly related to anemia, smoking or obesity. No scalp soft tissue abnormality. Sinuses/Orbits: Prior bilateral ocular lens replacement. Paranasal sinuses are clear. Large bilateral mastoid effusions noted, of uncertain significance. Visualized nasopharynx unremarkable. Other: None. IMPRESSION: 1. No acute intracranial abnormality. 2. Encephalomalacia with chronic hemosiderin staining involving the left occipital lobe, consistent with remote hemorrhage and/or hemorrhagic infarct. 3. Innumerable chronic micro hemorrhages involving the cerebellum, brainstem, and both cerebral hemispheres. These are most pronounced centrally, and favored to reflect sequelae of chronic poorly controlled hypertension. 4. Underlying mild-to-moderate chronic microvascular ischemic disease with multiple remote lacunar infarcts as above. 5. Large bilateral mastoid effusions, of uncertain significance. Correlation with physical exam recommended. Electronically Signed   By: Morene Hoard M.D.   On: 03/21/2024 21:29   CT HEAD WO CONTRAST Result Date: 03/21/2024 CLINICAL DATA:  Headache, dizziness, weakness EXAM: CT HEAD WITHOUT CONTRAST TECHNIQUE: Contiguous axial images were obtained from the base of the skull through the vertex  without intravenous contrast. RADIATION DOSE REDUCTION: This exam was performed according to the departmental dose-optimization program which includes automated exposure control, adjustment of the mA and/or kV according to patient size and/or use of iterative reconstruction technique. COMPARISON:  None Available. 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 Old right thalamic lacunar infarct. No acute intracranial abnormality. Specifically, no hemorrhage, hydrocephalus, mass lesion, acute infarction, or significant intracranial injury. Vascular: No hyperdense vessel or unexpected calcification. Skull: No acute calvarial abnormality. Sinuses/Orbits: No acute findings Other: None IMPRESSION: No acute intracranial abnormality. 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 Electronically Signed   By: Franky Crease M.D.   On: 03/21/2024 19:29  LOS: 0 days   Myrla Malanowski  [facility]   Pager on [email or web address]. If 7PM-7AM, please contact night-coverage at [email or web address]     03/22/2024, 3:09 PM

## 2024-03-23 DIAGNOSIS — G929 Unspecified toxic encephalopathy: Secondary | ICD-10-CM

## 2024-03-23 LAB — HEPATITIS B SURFACE ANTIBODY, QUANTITATIVE: Hep B S AB Quant (Post): 25.9 m[IU]/mL

## 2024-03-23 LAB — GLUCOSE, CAPILLARY: Glucose-Capillary: 119 mg/dL — ABNORMAL HIGH (ref 70–99)

## 2024-03-23 MED ORDER — OMEPRAZOLE 20 MG PO CPDR: 20.0000 mg | DELAYED_RELEASE_CAPSULE | Freq: Every day | ORAL | Status: AC

## 2024-03-23 NOTE — Progress Notes (Addendum)
 [facility]  ROUNDING NOTE   Subjective:   Patient sitting up in bed Alert Family at bedside Denies pain Room air  Objective:  Vital signs in last 24 hours:  Temp:  [98 F (36.7 C)-98.8 F (37.1 C)] 98.2 F (36.8 C) (09/30 0726) Pulse Rate:  [55-66] 55 (09/30 0726) Resp:  [13-18] 16 (09/30 0726) BP: (155-170)/(53-71) 167/67 (09/30 0941) SpO2:  [99 %-100 %] 99 % (09/30 0726)  Weight change:  There were no vitals filed for this visit.  Intake/Output: I/O last 3 completed shifts: In: 120 [P.O.:120] Out: 4000 [Other:4000]   Intake/Output this shift:  No intake/output data recorded.  Physical Exam: General: NAD  Head: Normocephalic, atraumatic. Moist oral mucosal membranes  Eyes: Anicteric  Lungs:  Clear to auscultation  Heart: Regular rate and rhythm  Abdomen:  Soft, nontender  Extremities:  No peripheral edema.  Neurologic: Awake, alert, conversant  Skin: Warm,dry, no rash  Access: Lt AVF    Basic Metabolic Panel: Recent Labs  Lab 03/21/24 1910 03/22/24 0349  NA 134* 136  K 4.8 4.1  CL 94* 96*  CO2 25 26  GLUCOSE 208* 186*  BUN 32* 32*  CREATININE 5.76* 5.97*  CALCIUM  9.8 9.7  MG  --  2.4  PHOS  --  3.2    Liver Function Tests: Recent Labs  Lab 03/21/24 1910 03/22/24 0349  AST 26 19  ALT 8 <5  ALKPHOS 156* 133*  BILITOT 1.3* 0.9  PROT 7.1 6.3*  ALBUMIN 4.0 3.6   No results for input(s): LIPASE, AMYLASE in the last 168 hours. No results for input(s): AMMONIA in the last 168 hours.  CBC: Recent Labs  Lab 03/21/24 1910 03/22/24 0349  WBC 3.9* 3.8*  NEUTROABS 2.8  --   HGB 10.5* 9.6*  HCT 32.1* 27.8*  MCV 97.9 94.9  PLT 132* 111*    Cardiac Enzymes: No results for input(s): CKTOTAL, CKMB, CKMBINDEX, TROPONINI in the last 168 hours.  BNP: Invalid input(s): POCBNP  CBG: Recent Labs  Lab 03/21/24 2313 03/22/24 0740 03/22/24 1657 03/22/24 2038 03/23/24 0728  GLUCAP 219* 179* 127* 148* 119*     Microbiology: Results for orders placed or performed during the hospital encounter of 03/21/24  MRSA Next Gen by PCR, Nasal     Status: None   Collection Time: 03/21/24 10:18 PM   Specimen: Nasal Mucosa; Nasal Swab  Result Value Ref Range Status   MRSA by PCR Next Gen NOT DETECTED NOT DETECTED Final    Comment: (NOTE) The GeneXpert MRSA Assay (FDA approved for NASAL specimens only), is one component of a comprehensive MRSA colonization surveillance program. It is not intended to diagnose MRSA infection nor to guide or monitor treatment for MRSA infections. Test performance is not FDA approved in patients less than 34 years old. Performed at [facility], [address] [place], [place] [address]     Coagulation Studies: Recent Labs    03/21/24 1950  LABPROT 15.2  INR 1.1    Urinalysis: No results for input(s): COLORURINE, LABSPEC, PHURINE, GLUCOSEU, HGBUR, BILIRUBINUR, KETONESUR, PROTEINUR, UROBILINOGEN, NITRITE, LEUKOCYTESUR in the last 72 hours.  Invalid input(s): APPERANCEUR    Imaging: MR BRAIN WO CONTRAST Result Date: 03/21/2024 CLINICAL DATA:  Initial evaluation for acute delirium, headache, dizziness. EXAM: MRI HEAD WITHOUT CONTRAST TECHNIQUE: Multiplanar, multiecho pulse sequences of the brain and surrounding structures were obtained without intravenous contrast. COMPARISON:  CT from earlier the same day. FINDINGS: Brain: Cerebral volume within normal limits. Patchy T2/FLAIR  hyperintensity involving the periventricular and deep white matter both cerebral hemispheres as well as the pons, consistent with chronic small vessel ischemic disease, mild-to-moderate in nature. Encephalomalacia and gliosis involving the left occipital lobe (series 12, image 23). Prominent chronic hemosiderin staining at this location. Finding likely reflects sequelae of remote hemorrhage and/or hemorrhagic infarct. Few additional small remote left cerebellar  infarcts noted. Chronic lacunar infarcts at the left lentiform nucleus, right thalamus, right periatrial white matter, and pons. No abnormal foci of restricted diffusion to suggest acute or subacute ischemia. No other areas of chronic cortical infarction. No acute intracranial hemorrhage. Innumerable chronic micro hemorrhages are seen involving the cerebellum, brainstem, and both cerebral hemispheres. These are most pronounced centrally, and favored to reflect sequelae of chronic poorly controlled hypertension. No mass lesion, midline shift or mass effect. No hydrocephalus or extra-axial fluid collection. Pituitary gland within normal limits. Vascular: Major intracranial vascular flow voids are maintained. Skull and upper cervical spine: Craniocervical junction normal limits. Decreased T1 signal intensity within the visualized bone marrow, nonspecific, but most commonly related to anemia, smoking or obesity. No scalp soft tissue abnormality. Sinuses/Orbits: Prior bilateral ocular lens replacement. Paranasal sinuses are clear. Large bilateral mastoid effusions noted, of uncertain significance. Visualized nasopharynx unremarkable. Other: None. IMPRESSION: 1. No acute intracranial abnormality. 2. Encephalomalacia with chronic hemosiderin staining involving the left occipital lobe, consistent with remote hemorrhage and/or hemorrhagic infarct. 3. Innumerable chronic micro hemorrhages involving the cerebellum, brainstem, and both cerebral hemispheres. These are most pronounced centrally, and favored to reflect sequelae of chronic poorly controlled hypertension. 4. Underlying mild-to-moderate chronic microvascular ischemic disease with multiple remote lacunar infarcts as above. 5. Large bilateral mastoid effusions, of uncertain significance. Correlation with physical exam recommended. Electronically Signed   By: Morene Hoard M.D.   On: 03/21/2024 21:29   CT HEAD WO CONTRAST Result Date: 03/21/2024 CLINICAL DATA:   Headache, dizziness, weakness EXAM: CT HEAD WITHOUT CONTRAST TECHNIQUE: Contiguous axial images were obtained from the base of the skull through the vertex without intravenous contrast. RADIATION DOSE REDUCTION: This exam was performed according to the departmental dose-optimization program which includes automated exposure control, adjustment of the mA and/or kV according to patient size and/or use of iterative reconstruction technique. COMPARISON:  None Available. 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 Old right thalamic lacunar infarct. No acute intracranial abnormality. Specifically, no hemorrhage, hydrocephalus, mass lesion, acute infarction, or significant intracranial injury. Vascular: No hyperdense vessel or unexpected calcification. Skull: No acute calvarial abnormality. Sinuses/Orbits: No acute findings Other: None IMPRESSION: No acute intracranial abnormality. 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 Electronically Signed   By: Franky Crease M.D.   On: 03/21/2024 19:29     Medications:     amLODipine   10 mg Oral Daily   atorvastatin   20 mg Oral QPM   Chlorhexidine Gluconate Cloth  6 each Topical Q0600   cinacalcet   30 mg Oral Q breakfast   heparin   5,000 Units Subcutaneous Q8H   hydrALAZINE   100 mg Oral BID   insulin  aspart  0-6 Units Subcutaneous TID WC   sevelamer  carbonate  800 mg Oral TID WC   sodium chloride  flush  3 mL Intravenous Once   acetaminophen  **OR** acetaminophen , ondansetron  **OR** ondansetron  (ZOFRAN ) IV  Assessment/ Plan:  Ms. Vanessa Salazar is a 68 y.o.  female with past medical history of diabetes, hypertension, glaucoma, and end stage renal disease on hemodialysis, who was admitted to [facility] on 03/21/2024 for Dizziness [R42] Altered mental status [  R41.82] Disorientation [R41.0] Adverse effect of drug, initial encounter [T50.905A]   Altered mental status, CT head and MRI brain negative. Believed due to toxic  encephalopathy  End stage renal disease on hemodialysis. Received treatment yesterday, no concerns. Next treatment scheduled for Wednesday   3. Anemia of chronic kidney disease Lab Results  Component Value Date   HGB 9.6 (L) 03/22/2024    Hgb acceptable. Continue Mircera at outpatient clinic  4. Secondary Hyperparathyroidism: with outpatient labs: None available Lab Results  Component Value Date   CALCIUM  9.7 03/22/2024   PHOS 3.2 03/22/2024    Continue sevelamer  with meals.    LOS: 0 Jammal Sarr 9/30/20251:03 PM

## 2024-03-23 NOTE — Discharge Summary (Addendum)
 Physician Discharge Summary   Patient: Vanessa Salazar MRN: [id number] DOB: [birth year]  Admit date:     03/21/2024  Discharge date: 03/23/24  Discharge Physician: AIDA CHO   PCP: Ernie Yancy Roof, MD   Recommendations at discharge:   Follow-up with PCP in 1 week Outpatient follow-up with [facility] in 2 weeks Continue outpatient hemodialysis as scheduled  Discharge Diagnoses: Principal Problem:   Toxic encephalopathy Active Problems:   ESRD on hemodialysis (HCC)   Leukopenia   Thrombocytopenia   Anemia of chronic disease   Elevated TSH   Type 2 diabetes mellitus with hyperglycemia (HCC)   Essential hypertension   Mixed hyperlipidemia  Resolved Problems:   * No resolved hospital problems. *  Hospital Course:  Vanessa Salazar is a 68 y.o. female  with medical history significant of hypertension, T2DM, ESRD on HD (MWF), history of anterior uveitis, severe glaucoma of both eye, who presented to the hospital with altered mental status.  She was recently seen at [facility] ophthalmology clinic on 03/17/2024 for corneal abnormality and was prescribed Valtrex.  She developed general weakness, lightheadedness, dizziness, headache and confusion since starting the Valtrex.  She had difficulty ambulating because of worsening symptoms so she was brought to the ED for further evaluation.   CT head and MRI brain did not show any acute abnormality.    Assessment and Plan:   Acute toxic encephalopathy (dizziness, confusion): Likely due to Valtrex.  Valtrex has been discontinued at the recommendation of Dr. Myrna, ophthalmologist, who was on duty on 03/21/2024. Headache, confusion and dizziness have resolved.     General weakness: She did well with PT.  Home health PT was recommended.     ESRD: Continue hemodialysis as an outpatient     Hypertension: Continue antihypertensives     Insulin -dependent diabetes mellitus: She takes insulin  NPH 8 to 10 units twice daily at  home.     Elevated TSH/likely subclinical hypothyroidism: TSH 7.592 but free T4 and free T3 were normal.     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     Comorbidities include chronic anemia, chronic thrombocytopenia, hyperlipidemia, glaucoma   She is deemed medically stable for discharge to home today. Discharge plan was discussed with patient and her husband at the bedside. [name] ID # [id number] with a Spanish interpreter (via iPad) for this encounter.        Consultants: Nephrologist Procedures performed: None Disposition: Home health Diet recommendation:  Discharge Diet Orders (From admission, onward)     Start     Ordered   03/23/24 0000  Diet renal/carb modified with fluid restriction        03/23/24 1043           Renal diet, diabetic diet DISCHARGE MEDICATION: Allergies as of 03/23/2024   No Known Allergies      Medication List     STOP taking these medications    HYDROcodone -acetaminophen  7.5-325 MG tablet Commonly known as: NORCO   oxyCODONE  10 mg 12 hr tablet Commonly known as: OXYCONTIN        TAKE these medications    acetaminophen  325 MG tablet Commonly known as: TYLENOL  Take 650 mg by mouth every 6 (six) hours as needed.   amLODipine  10 MG tablet Commonly known as: NORVASC  Take 10 mg by mouth daily.   atorvastatin  20 MG tablet Commonly known as: LIPITOR Take 20 mg by mouth every evening.  cinacalcet  30 MG tablet Commonly known as: SENSIPAR  Take 30 mg by mouth daily.   furosemide  40 MG tablet Commonly known as: LASIX  Take 80 mg by mouth daily.   hydrALAZINE  100 MG tablet Commonly known as: APRESOLINE  Take 100 mg by mouth 2 (two) times daily.   ibuprofen  400 MG tablet Commonly known as: ADVIL  Take 1 tablet (400 mg total) by mouth every 6 (six) hours as needed.   insulin  NPH-regular Human (70-30)  100 UNIT/ML injection Inject 8-10 Units into the skin 2 (two) times daily.   lidocaine  5 % Commonly known as: LIDODERM  Place 1 patch onto the skin daily. Remove & Discard patch within 12 hours or as directed by MD   methocarbamol  500 MG tablet Commonly known as: ROBAXIN  Take 1 tablet (500 mg total) by mouth every 8 (eight) hours as needed for muscle spasms.   omeprazole 20 MG capsule Commonly known as: PRILOSEC Take 1 capsule (20 mg total) by mouth daily. What changed:  medication strength how much to take   Renvela  800 MG tablet Generic drug: sevelamer  carbonate Take by mouth.   sevelamer  800 MG tablet Commonly known as: RENAGEL  Take 1,600 mg by mouth 4 (four) times daily as needed.        Follow-up Information     [facility] Follow up in 2 week(s).   Why: Bilateral mastoid effusion, hearing impairment. The office said the patient has to call to set up appointment. Please call when you get home. Contact information: [address] [place]  [address] [phone]               Discharge Exam:  GEN: NAD SKIN: Warm and dry EYES: No pallor or icterus ENT: MMM CV: RRR PULM: CTA B ABD: soft, ND, NT, +BS CNS: AAO x 3, non focal EXT: No edema or tenderness   Condition at discharge: good  The results of significant diagnostics from this hospitalization (including imaging, microbiology, ancillary and laboratory) are listed below for reference.   Imaging Studies: MR BRAIN WO CONTRAST Result Date: 03/21/2024 CLINICAL DATA:  Initial evaluation for acute delirium, headache, dizziness. EXAM: MRI HEAD WITHOUT CONTRAST TECHNIQUE: Multiplanar, multiecho pulse sequences of the brain and surrounding structures were obtained without intravenous contrast. COMPARISON:  CT from earlier the same day. FINDINGS: Brain: Cerebral volume within normal limits. Patchy T2/FLAIR hyperintensity involving the periventricular and deep white  matter both cerebral hemispheres as well as the pons, consistent with chronic small vessel ischemic disease, mild-to-moderate in nature. Encephalomalacia and gliosis involving the left occipital lobe (series 12, image 23). Prominent chronic hemosiderin staining at this location. Finding likely reflects sequelae of remote hemorrhage and/or hemorrhagic infarct. Few additional small remote left cerebellar infarcts noted. Chronic lacunar infarcts at the left lentiform nucleus, right thalamus, right periatrial white matter, and pons. No abnormal foci of restricted diffusion to suggest acute or subacute ischemia. No other areas of chronic cortical infarction. No acute intracranial hemorrhage. Innumerable chronic micro hemorrhages are seen involving the cerebellum, brainstem, and both cerebral hemispheres. These are most pronounced centrally, and favored to reflect sequelae of chronic poorly controlled hypertension. No mass lesion, midline shift or mass effect. No hydrocephalus or extra-axial fluid collection. Pituitary gland within normal limits. Vascular: Major intracranial vascular flow voids are maintained. Skull and upper cervical spine: Craniocervical junction normal limits. Decreased T1 signal intensity within the visualized bone marrow, nonspecific, but most commonly related to anemia, smoking or obesity. No scalp soft tissue abnormality. Sinuses/Orbits:  Prior bilateral ocular lens replacement. Paranasal sinuses are clear. Large bilateral mastoid effusions noted, of uncertain significance. Visualized nasopharynx unremarkable. Other: None. IMPRESSION: 1. No acute intracranial abnormality. 2. Encephalomalacia with chronic hemosiderin staining involving the left occipital lobe, consistent with remote hemorrhage and/or hemorrhagic infarct. 3. Innumerable chronic micro hemorrhages involving the cerebellum, brainstem, and both cerebral hemispheres. These are most pronounced centrally, and favored to reflect sequelae of  chronic poorly controlled hypertension. 4. Underlying mild-to-moderate chronic microvascular ischemic disease with multiple remote lacunar infarcts as above. 5. Large bilateral mastoid effusions, of uncertain significance. Correlation with physical exam recommended. Electronically Signed   By: Morene Hoard M.D.   On: 03/21/2024 21:29   CT HEAD WO CONTRAST Result Date: 03/21/2024 CLINICAL DATA:  Headache, dizziness, weakness EXAM: CT HEAD WITHOUT CONTRAST TECHNIQUE: Contiguous axial images were obtained from the base of the skull through the vertex without intravenous contrast. RADIATION DOSE REDUCTION: This exam was performed according to the departmental dose-optimization program which includes automated exposure control, adjustment of the mA and/or kV according to patient size and/or use of iterative reconstruction technique. COMPARISON:  None Available. 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 Old right thalamic lacunar infarct. No acute intracranial abnormality. Specifically, no hemorrhage, hydrocephalus, mass lesion, acute infarction, or significant intracranial injury. Vascular: No hyperdense vessel or unexpected calcification. Skull: No acute calvarial abnormality. Sinuses/Orbits: No acute findings Other: None IMPRESSION: No acute intracranial abnormality. 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 Electronically Signed   By: Franky Crease M.D.   On: 03/21/2024 19:29    Microbiology: Results for orders placed or performed during the hospital encounter of 03/21/24  MRSA Next Gen by PCR, Nasal     Status: None   Collection Time: 03/21/24 10:18 PM   Specimen: Nasal Mucosa; Nasal Swab  Result Value Ref Range Status   MRSA by PCR Next Gen NOT DETECTED NOT DETECTED Final    Comment: (NOTE) The GeneXpert MRSA Assay (FDA approved for NASAL specimens only), is one component of a comprehensive MRSA colonization surveillance program. It is not intended to  diagnose MRSA infection nor to guide or monitor treatment for MRSA infections. Test performance is not FDA approved in patients less than 72 years old. Performed at [facility], [address] [place], [place] [address]     Labs: CBC: Recent Labs  Lab 03/21/24 1910 03/22/24 0349  WBC 3.9* 3.8*  NEUTROABS 2.8  --   HGB 10.5* 9.6*  HCT 32.1* 27.8*  MCV 97.9 94.9  PLT 132* 111*   Basic Metabolic Panel: Recent Labs  Lab 03/21/24 1910 03/22/24 0349  NA 134* 136  K 4.8 4.1  CL 94* 96*  CO2 25 26  GLUCOSE 208* 186*  BUN 32* 32*  CREATININE 5.76* 5.97*  CALCIUM  9.8 9.7  MG  --  2.4  PHOS  --  3.2   Liver Function Tests: Recent Labs  Lab 03/21/24 1910 03/22/24 0349  AST 26 19  ALT 8 <5  ALKPHOS 156* 133*  BILITOT 1.3* 0.9  PROT 7.1 6.3*  ALBUMIN 4.0 3.6   CBG: Recent Labs  Lab 03/21/24 2313 03/22/24 0740 03/22/24 1657 03/22/24 2038 03/23/24 0728  GLUCAP 219* 179* 127* 148* 119*    Discharge time spent: greater than 30 minutes.  Signed: AIDA CHO, MD [facility] 03/23/2024

## 2024-03-23 NOTE — Progress Notes (Signed)
 D/c orders noted. Contacted pt out-pt hd clinic, [facility] MWF @ 5am, to inform of pt d/c and expected arrival back at clinic. D/c summary has been faxed over. No further assistance needed at this time.    Suzen Satchel Dialysis Navigator [phone]

## 2024-03-23 NOTE — Evaluation (Signed)
 Physical Therapy Evaluation Patient Details Name: Vanessa Salazar MRN: [id number] DOB: 08/20/1955 Today's Date: 03/23/2024  History of Present Illness  Pt is a 68 y/o F presenting to ED with c/o headache, dizziness, weakness, and confusion. Imaging negative for intracranial abnormality. MD diagnosis suspects dizziness 2/2 adverse drug reaction. PMH significant for HTN, T2DM, ESRD on HD (MWF), hx of anterior uveitis, glaucoma of both eyes.   Clinical Impression  Pt A&Ox4, agreeable to participate in PT evaluation. Interpreter Alan utilized throughout session. At baseline, pt reports being modI with RW for household/community amb, WC on days she receives dialysis, denies hx of falls. Pt was met semi-supine in bed, supervision for bed mobility with inc time/effort. STS from EOB with RW, min VC for hand placement. Pt amb ~130ft with RW, no LOB and mild decreased stance time on RLE- suspect due to pt reported chronic R hip pain. Pt receptive to education provided on importance of home/community amb to maintain functional mobility status. Pt was left seated in recliner at end of session, all needs in reach. Pt would benefit from skilled PT intervention to address listed deficits (see PT Problem List) and allow for safe return to PLOF.       If plan is discharge home, recommend the following: A little help with walking and/or transfers;A little help with bathing/dressing/bathroom;Assist for transportation;Help with stairs or ramp for entrance   Can travel by private vehicle        Equipment Recommendations None recommended by PT (pt has recommended DME)  Recommendations for Other Services       Functional Status Assessment Patient has had a recent decline in their functional status and demonstrates the ability to make significant improvements in function in a reasonable and predictable amount of time.     Precautions / Restrictions Precautions Precautions: Fall Recall of  Precautions/Restrictions: Intact Restrictions Weight Bearing Restrictions Per Provider Order: No      Mobility  Bed Mobility Overal bed mobility: Needs Assistance Bed Mobility: Supine to Sit     Supine to sit: Supervision     General bed mobility comments: no physical assistance required, inc time/effort    Transfers Overall transfer level: Needs assistance Equipment used: Rolling walker (2 wheels) Transfers: Sit to/from Stand Sit to Stand: Contact guard assist           General transfer comment: STS from EOB with CGA, VC for hand placement    Ambulation/Gait Ambulation/Gait assistance: Contact guard assist Gait Distance (Feet): 100 Feet Assistive device: Rolling walker (2 wheels) Gait Pattern/deviations: Step-through pattern, Decreased stance time - right, Decreased stride length, Narrow base of support Gait velocity: decreased     General Gait Details: no LOB, decreased cadence, mild antalgic gait on RLE  Stairs            Wheelchair Mobility     Tilt Bed    Modified Rankin (Stroke Patients Only)       Balance Overall balance assessment: Needs assistance Sitting-balance support: Feet supported Sitting balance-Leahy Scale: Good Sitting balance - Comments: steady static and dynamic sitting   Standing balance support: Bilateral upper extremity supported Standing balance-Leahy Scale: Fair Standing balance comment: requires BUE support for dynamic standing tasks                             Pertinent Vitals/Pain Pain Assessment Pain Assessment: No/denies pain    Home Living Family/patient expects to be discharged to:: Private residence  Living Arrangements: Spouse/significant other Available Help at Discharge: Family;Available PRN/intermittently Type of Home: House Home Access: Ramped entrance       Home Layout: One level Home Equipment: Agricultural consultant (2 wheels);Wheelchair - manual;Toilet riser Additional Comments: pt's  spouse works during the day    Prior Function Prior Level of Function : Independent/Modified Independent             Mobility Comments: pt reports using RW for household and community amb, uses WC on days she has dialysis due to fatigue, denies hx of recent falls ADLs Comments: IND with basic ADLs     Extremity/Trunk Assessment   Upper Extremity Assessment Upper Extremity Assessment: Defer to OT evaluation    Lower Extremity Assessment Lower Extremity Assessment: Generalized weakness (pt reports pain in R hip)       Communication   Communication Communication: Other (comment) Factors Affecting Communication: Non - English speaking, interpreter not available    Cognition Arousal: Alert Behavior During Therapy: WFL for tasks assessed/performed   PT - Cognitive impairments: No apparent impairments                       PT - Cognition Comments: A&Ox4 Following commands: Intact       Cueing Cueing Techniques: Verbal cues, Visual cues     General Comments      Exercises     Assessment/Plan    PT Assessment Patient needs continued PT services  PT Problem List Decreased strength;Decreased activity tolerance;Decreased balance;Decreased mobility       PT Treatment Interventions DME instruction;Gait training;Functional mobility training;Therapeutic activities;Therapeutic exercise;Balance training;Neuromuscular re-education;Patient/family education    PT Goals (Current goals can be found in the Care Plan section)  Acute Rehab PT Goals Patient Stated Goal: to go home PT Goal Formulation: With patient Time For Goal Achievement: 04/06/24 Potential to Achieve Goals: Good    Frequency Min 2X/week     Co-evaluation               AM-PAC PT 6 Clicks Mobility  Outcome Measure Help needed turning from your back to your side while in a flat bed without using bedrails?: None Help needed moving from lying on your back to sitting on the side of a flat  bed without using bedrails?: A Little Help needed moving to and from a bed to a chair (including a wheelchair)?: A Little Help needed standing up from a chair using your arms (e.g., wheelchair or bedside chair)?: A Little Help needed to walk in hospital room?: A Little Help needed climbing 3-5 steps with a railing? : A Little 6 Click Score: 19    End of Session Equipment Utilized During Treatment: Gait belt Activity Tolerance: Patient tolerated treatment well Patient left: in chair;with call bell/phone within reach;with chair alarm set;with family/visitor present Nurse Communication: Mobility status PT Visit Diagnosis: Unsteadiness on feet (R26.81);Muscle weakness (generalized) (M62.81);Difficulty in walking, not elsewhere classified (R26.2)    Time: 9090-9060 PT Time Calculation (min) (ACUTE ONLY): 30 min   Charges:   PT Evaluation $PT Eval Low Complexity: 1 Low   PT General Charges $$ ACUTE PT VISIT: 1 Visit         Janell Axe, SPT

## 2024-03-23 NOTE — Plan of Care (Signed)

## 2024-03-23 NOTE — TOC CM/SW Note (Signed)
 Transition of Care [facility]) - Inpatient Brief Assessment   Patient Details  Name: Vanessa Salazar MRN: [id number] Date of Birth: 1956/05/21  Transition of Care [facility]) CM/SW Contact:    Alfonso Rummer, LCSW Phone Number: 03/23/2024, 9:10 AM   Clinical Narrative: [name] Rummer completed TOC chart review No TOC needs identified please contact should need arise.    Transition of Care Asessment:

## 2024-03-24 LAB — T3, FREE: T3, Free: 1.9 pg/mL — ABNORMAL LOW (ref 2.0–4.4)

## 2024-04-22 ENCOUNTER — Ambulatory Visit (LOCAL_COMMUNITY_HEALTH_CENTER): Payer: Self-pay

## 2024-04-22 DIAGNOSIS — Z23 Encounter for immunization: Secondary | ICD-10-CM

## 2024-04-22 DIAGNOSIS — Z719 Counseling, unspecified: Secondary | ICD-10-CM

## 2024-04-22 DIAGNOSIS — Z0184 Encounter for antibody response examination: Secondary | ICD-10-CM

## 2024-04-22 NOTE — Progress Notes (Signed)
 Patient seen in nurse clinic for immigration vaccines.  Vaccines requested were MMR, Varicella and Polio.  Patient reported she had chicken pox and measles, mumps and rubella.  [facility] contacted and reported clinic does not perform titers.  Patient wanted to get titers today and get Polio vaccine.   Patient will be contacted regarding lab results on Monday, if results in.  Appointment made for vaccinations on Tuesday at 4 pm in case labs do not show immunity. Patient contacted about appointment for Tuesday, 04/27/24 at 4 PM.  [employer] application completed and ready to send for approval if Varicella vaccine needed.

## 2024-04-23 ENCOUNTER — Ambulatory Visit: Payer: Self-pay

## 2024-04-23 LAB — MEASLES/MUMPS/RUBELLA IMMUNITY
MUMPS ABS, IGG: <9 [AU]/ml — ABNORMAL LOW (ref >10.9)
RUBEOLA AB, IGG: 56.5 [AU]/ml (ref >16.4)
Rubella Antibodies, IGG: 15.8 {index} (ref >0.99)

## 2024-04-23 LAB — VARICELLA ZOSTER ANTIBODY, IGG: Varicella zoster IgG: REACTIVE

## 2024-04-23 NOTE — Progress Notes (Signed)
 According to MMR titer needs to receive MMR vaccine. Noted immunity to varicella. Will contact pt to set up appt.

## 2024-04-26 NOTE — Telephone Encounter (Signed)
 Phone call to Sheree Sprung with help of Randall Pilsner regarding MMR and Varicella results and appt for 4 PM on 04/27/2024. Varicella titer indicate immunity therefore no vaccine needed.  MMR - no immunity and appointment scheduled for 4 PM on 04/27/2024.

## 2024-04-27 ENCOUNTER — Ambulatory Visit: Payer: Self-pay

## 2024-04-30 ENCOUNTER — Ambulatory Visit (LOCAL_COMMUNITY_HEALTH_CENTER): Payer: Self-pay

## 2024-04-30 DIAGNOSIS — Z719 Counseling, unspecified: Secondary | ICD-10-CM

## 2024-04-30 DIAGNOSIS — Z23 Encounter for immunization: Secondary | ICD-10-CM

## 2024-04-30 NOTE — Progress Notes (Signed)
 In nurse clinic for immunizations . Spanish interpreter present I.Teixeira during visit. Received Tdap and MMR vaccine today. VIS given and copies of NCIR. Pt qualifies for VFA 317 . Tolerated vaccines well. Pt received both vaccines in [place]. Is on hemodialysis.

## 2024-05-14 ENCOUNTER — Ambulatory Visit: Payer: Self-pay
